# Patient Record
Sex: Female | Born: 1985 | Race: White | Hispanic: No | Marital: Married | Smoking: Never smoker
Health system: Southern US, Community
[De-identification: ages and names within clinical notes are randomized; demographics above are authoritative.]

## PROBLEM LIST (undated history)

## (undated) ENCOUNTER — Inpatient Hospital Stay: Payer: Self-pay

## (undated) DIAGNOSIS — E538 Deficiency of other specified B group vitamins: Secondary | ICD-10-CM

## (undated) DIAGNOSIS — M419 Scoliosis, unspecified: Secondary | ICD-10-CM

## (undated) DIAGNOSIS — K589 Irritable bowel syndrome without diarrhea: Secondary | ICD-10-CM

## (undated) DIAGNOSIS — E039 Hypothyroidism, unspecified: Secondary | ICD-10-CM

## (undated) DIAGNOSIS — O24419 Gestational diabetes mellitus in pregnancy, unspecified control: Secondary | ICD-10-CM

## (undated) DIAGNOSIS — T7840XA Allergy, unspecified, initial encounter: Secondary | ICD-10-CM

## (undated) HISTORY — PX: CHOLECYSTECTOMY: SHX55

## (undated) HISTORY — DX: Gestational diabetes mellitus in pregnancy, unspecified control: O24.419

## (undated) HISTORY — DX: Scoliosis, unspecified: M41.9

## (undated) HISTORY — DX: Irritable bowel syndrome, unspecified: K58.9

## (undated) HISTORY — DX: Allergy, unspecified, initial encounter: T78.40XA

## (undated) HISTORY — PX: WISDOM TOOTH EXTRACTION: SHX21

## (undated) HISTORY — PX: COLONOSCOPY: SHX174

---

## 2009-02-04 ENCOUNTER — Ambulatory Visit: Payer: Self-pay | Admitting: Sports Medicine

## 2009-02-04 DIAGNOSIS — M66259 Spontaneous rupture of extensor tendons, unspecified thigh: Secondary | ICD-10-CM | POA: Insufficient documentation

## 2009-02-15 ENCOUNTER — Telehealth (INDEPENDENT_AMBULATORY_CARE_PROVIDER_SITE_OTHER): Payer: Self-pay | Admitting: *Deleted

## 2009-02-15 ENCOUNTER — Encounter: Payer: Self-pay | Admitting: Sports Medicine

## 2009-07-15 ENCOUNTER — Emergency Department: Payer: Self-pay | Admitting: Emergency Medicine

## 2010-07-28 ENCOUNTER — Ambulatory Visit: Payer: Self-pay | Admitting: Urology

## 2010-08-01 ENCOUNTER — Ambulatory Visit: Payer: Self-pay | Admitting: Urology

## 2010-12-25 ENCOUNTER — Encounter: Payer: Self-pay | Admitting: *Deleted

## 2012-07-18 ENCOUNTER — Other Ambulatory Visit: Payer: Self-pay | Admitting: Internal Medicine

## 2012-07-18 ENCOUNTER — Ambulatory Visit: Payer: Self-pay | Admitting: Internal Medicine

## 2012-07-18 LAB — PROTIME-INR: INR: 0.9

## 2012-07-19 ENCOUNTER — Ambulatory Visit: Payer: Self-pay | Admitting: Surgery

## 2012-07-24 LAB — PATHOLOGY REPORT

## 2014-08-19 ENCOUNTER — Emergency Department: Payer: Self-pay | Admitting: Emergency Medicine

## 2014-08-19 LAB — COMPREHENSIVE METABOLIC PANEL
ALBUMIN: 3.7 g/dL (ref 3.4–5.0)
ALK PHOS: 68 U/L
Anion Gap: 12 (ref 7–16)
BUN: 7 mg/dL (ref 7–18)
Bilirubin,Total: 0.5 mg/dL (ref 0.2–1.0)
CALCIUM: 8.7 mg/dL (ref 8.5–10.1)
CHLORIDE: 109 mmol/L — AB (ref 98–107)
CREATININE: 0.52 mg/dL — AB (ref 0.60–1.30)
Co2: 22 mmol/L (ref 21–32)
Glucose: 71 mg/dL (ref 65–99)
POTASSIUM: 4.6 mmol/L (ref 3.5–5.1)
SGOT(AST): 38 U/L — ABNORMAL HIGH (ref 15–37)
SGPT (ALT): 27 U/L
Sodium: 143 mmol/L (ref 136–145)
TOTAL PROTEIN: 7.3 g/dL (ref 6.4–8.2)

## 2014-08-19 LAB — URINALYSIS, COMPLETE
BACTERIA: NONE SEEN
Bilirubin,UR: NEGATIVE
GLUCOSE, UR: NEGATIVE mg/dL (ref 0–75)
KETONE: NEGATIVE
LEUKOCYTE ESTERASE: NEGATIVE
NITRITE: NEGATIVE
PROTEIN: NEGATIVE
Ph: 7 (ref 4.5–8.0)
Specific Gravity: 1.004 (ref 1.003–1.030)
WBC UR: 2 /HPF (ref 0–5)

## 2014-08-19 LAB — CBC
HCT: 45.6 % (ref 35.0–47.0)
HGB: 15 g/dL (ref 12.0–16.0)
MCH: 29.3 pg (ref 26.0–34.0)
MCHC: 32.9 g/dL (ref 32.0–36.0)
MCV: 89 fL (ref 80–100)
PLATELETS: 251 10*3/uL (ref 150–440)
RBC: 5.12 10*6/uL (ref 3.80–5.20)
RDW: 12.5 % (ref 11.5–14.5)
WBC: 6.8 10*3/uL (ref 3.6–11.0)

## 2014-08-19 LAB — WET PREP, GENITAL

## 2014-08-19 LAB — LIPASE, BLOOD: Lipase: 99 U/L (ref 73–393)

## 2014-08-20 LAB — GC/CHLAMYDIA PROBE AMP

## 2014-09-30 ENCOUNTER — Ambulatory Visit: Payer: Self-pay | Admitting: Gastroenterology

## 2014-10-05 ENCOUNTER — Ambulatory Visit: Payer: Self-pay | Admitting: Gastroenterology

## 2014-11-17 ENCOUNTER — Ambulatory Visit: Payer: Self-pay

## 2015-02-09 NOTE — H&P (Signed)
PATIENT NAME:  Kari Moore, Kari Moore MR#:  [id number] DATE OF BIRTH:  [birth year]  DATE OF ADMISSION:  07/18/2012  HISTORY OF PRESENT ILLNESS: This 29 year old accompanied by her mother comes in the office with chief complaint of epigastric pain which started two days ago, was worse yesterday, was in the epigastric area and slightly to the right of the midline accompanied by nausea and decreased oral intake. She was prescribed Dexilant which did not help. She has also had some dizziness. She reports no radiation into her back. Minimal degree of heartburn. She is moving her bowels satisfactorily, voiding satisfactorily. Has noted no change in skin color.   She had ultrasound which demonstrated some slight thickening of the gallbladder wall, positive ultrasonic Murphy sign and sludge within the gallbladder.   PAST MEDICAL HISTORY: Has had scoliosis and some mild chronic intermittent back pains.  She has no history of hepatitis. No other major medical problems.   PAST SURGICAL HISTORY: Extraction of wisdom teeth.  MEDICATIONS: None.    ALLERGIES: None.   FAMILY HISTORY: Positive for hypertension and ulcers.   SOCIAL HISTORY: Does not smoke. Does not drink any alcohol. She is accompanied by her mother.   REVIEW OF SYSTEMS: Has had some mild increasing pain with deep breath. Review of systems otherwise negative.   PHYSICAL EXAMINATION:   GENERAL: She is awake, alert, and oriented resting on the exam table.   VITAL SIGNS: Height 5 feet 1 inch, weight 131, blood pressure 106/73, pulse 77.   SKIN: Warm and dry without rash or jaundice.   HEENT: Pupils equal, reactive to light. Extraocular movements are intact. Sclerae clear. Pharynx clear.   NECK: No palpable mass.   LUNGS: Lung sounds are clear.   HEART: Regular rhythm, S1, S2.   ABDOMEN: Moderate right upper quadrant tenderness with some guarding. Lower abdomen is soft and nontender.   EXTREMITIES: No dependent edema.   NEUROLOGIC:  Awake, alert, oriented, moving all extremities.   LABORATORY DATA: Lab work done today including metabolic panel C was normal. CBC normal. Amylase normal.   I reviewed her ultrasound which demonstrates sludge within the gallbladder, slight thickening of gallbladder wall.   IMPRESSION: Acute cholecystitis, cholelithiasis.   RECOMMENDATIONS: I recommended laparoscopic cholecystectomy. I discussed the operation, care, risks and benefits with her in detail. We are going to get this set up for tomorrow. I have called the operating room and also discussed with outpatient surgery department. Will have her arrive there tomorrow at 11:15.   I discussed sipping slowly on liquids and eating some low fat foods this evening and n.p.o. past midnight.   ____________________________ J. Renda RollsWilton Smith, MD jws:drc D: 07/18/2012 16:01:57 ET T: 07/18/2012 16:25:37 ET JOB#: [id number] cc: Adella HareJ. Wilton Smith, MD, <Dictator>, Danella PentonMark F. Miller, MD  Adella HareWILTON J SMITH MD ELECTRONICALLY SIGNED 07/19/2012 9:45

## 2015-02-09 NOTE — Op Note (Signed)
PATIENT NAME:  Kari Moore, Kari Moore MR#:  [id number] DATE OF BIRTH:  24-Jan-1986  DATE OF PROCEDURE:  07/19/2012  PREOPERATIVE DIAGNOSIS: Cholecystitis, cholelithiasis.   POSTOPERATIVE DIAGNOSIS: Cholecystitis, cholelithiasis.   PROCEDURE: Laparoscopic cholecystectomy.   SURGEON: Adella HareJ. Wilton Smith, MD  ANESTHESIA: General.   INDICATIONS: This 29 year old female came in with epigastric pain and nausea. Ultrasound findings of sludge within the gallbladder and slight thickening of the gallbladder wall with right upper quadrant tenderness. Surgery was recommended for definitive treatment.   DESCRIPTION OF PROCEDURE: The patient was placed on the operating table in the supine position under general endotracheal anesthesia. The abdomen was prepared with ChloraPrep and draped in a sterile manner. A short incision was made in the inferior aspect of the umbilicus and carried down to the deep fascia which was grasped with laryngeal hook and elevated. A Veress needle was inserted, aspirated, and irrigated with a saline solution. Next, the peritoneal cavity was inflated with carbon dioxide. The Veress needle was removed. The 10 mm cannula was inserted. The 10 mm, 0 degree laparoscope was inserted to view the peritoneal cavity. The liver appeared normal. Uterus appeared normal and visible intestines appeared normal. Another incision was made in the epigastrium slightly to the right of the midline to introduce an 11 mm cannula. Two incisions were made in the lateral aspect of the right upper quadrant to introduce two 5 mm cannulas.   The gallbladder was retracted towards the right shoulder. The infundibulum was retracted inferiorly and laterally. The porta hepatis was demonstrated. The gallbladder was mobilized with incision of the visceral peritoneum. The cystic duct was dissected free from surrounding structures. Cystic artery was dissected free from surrounding structures. A critical view of safety was demonstrated.  An endoclip was placed across the cystic duct adjacent to the neck of the gallbladder. An incision was made in the cystic duct to introduce a Reddick catheter, however, it was found that the cystic duct was very small and the Reddick catheter would not thread and therefore cholangiogram was not done. The Reddick catheter was removed. The cystic duct was doubly ligated with endoclips and divided. The cystic artery was controlled with a single endoclip and divided. The gallbladder was dissected free from the liver with hook and cautery. Bleeding was very scant. Hemostasis was subsequently intact. The gallbladder was delivered up through the infraumbilical incision, opened and suctioned, removed and identified a small amount of sludge within the gallbladder and was submitted in formalin for routine pathology. The right upper quadrant was further inspected. Hemostasis was intact. The cannulas were removed. Carbon dioxide was allowed to escape from the peritoneal cavity. The fascial defect at the umbilicus was closed with a 3-0 Vicryl simple suture. The skin incisions were closed with interrupted 5-0 chromic subcuticular sutures, benzoin, and Steri-Strips. Dressings were applied with paper tape. The patient tolerated surgery satisfactorily and was moved to the recovery room for postoperative care.  ____________________________ J. Renda RollsWilton Smith, MD jws:cms D: 07/19/2012 14:58:55 ET T: 07/19/2012 15:59:04 ET JOB#: [id number]  cc: Adella HareJ. Wilton Smith, MD, <Dictator> Adella HareWILTON J SMITH MD ELECTRONICALLY SIGNED 07/20/2012 18:27

## 2015-09-20 IMAGING — CT CT ABD-PELV W/ CM
2 of 4 series · 16 of 46 positions shown, 18 images · IV contrast (isovue)
Comparison: None.

CLINICAL DATA: Right lower quadrant pain

EXAM:
CT ABDOMEN AND PELVIS WITH CONTRAST
TECHNIQUE: Multidetector CT imaging of the abdomen and pelvis was performed
using the standard protocol following bolus administration of
intravenous contrast.
CONTRAST:  100 cc of Isovue 300

[Series 2: routine abd pel with · axial · 0.65mm/px · z∈[-986,-591]mm · 13 of 87 slices shown, 15 images]
[im 4/87  soft-tissue]
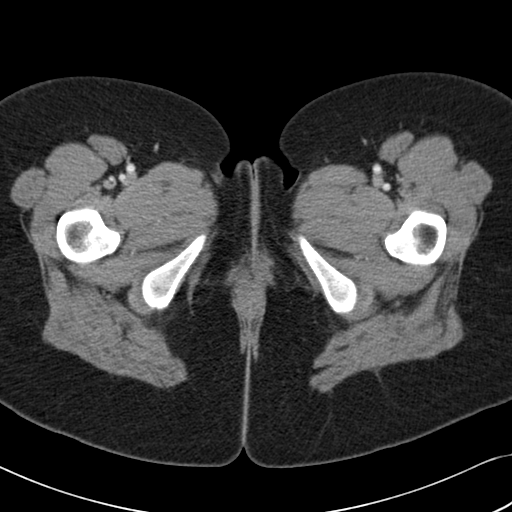
[im 4/87  bone]
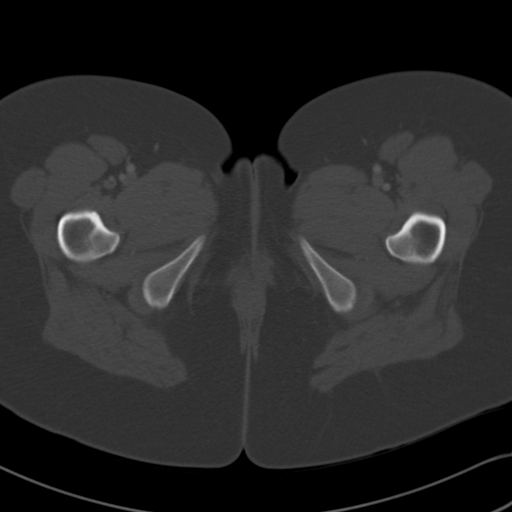
[im 11/87  soft-tissue]
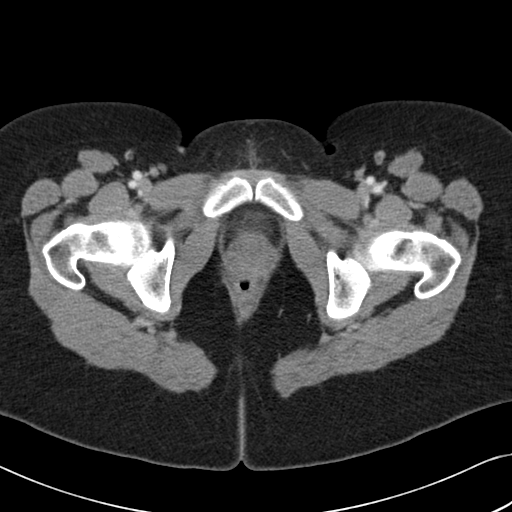
[im 18/87  soft-tissue]
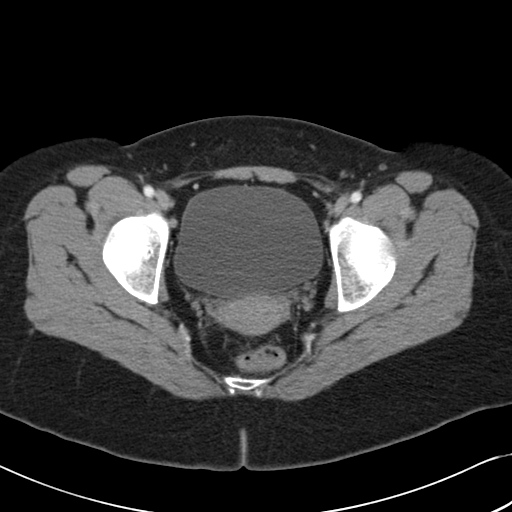
[im 25/87  soft-tissue]
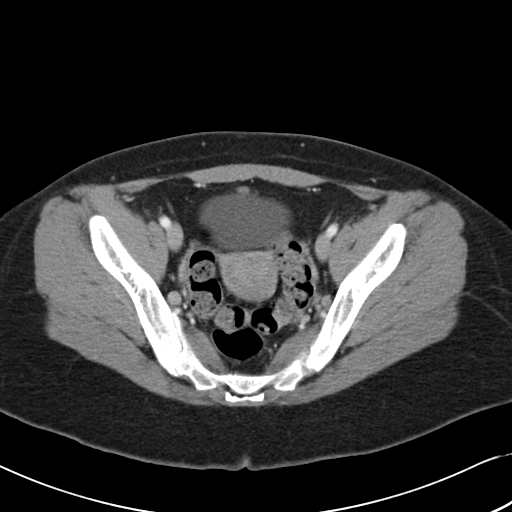
[im 31/87  soft-tissue]
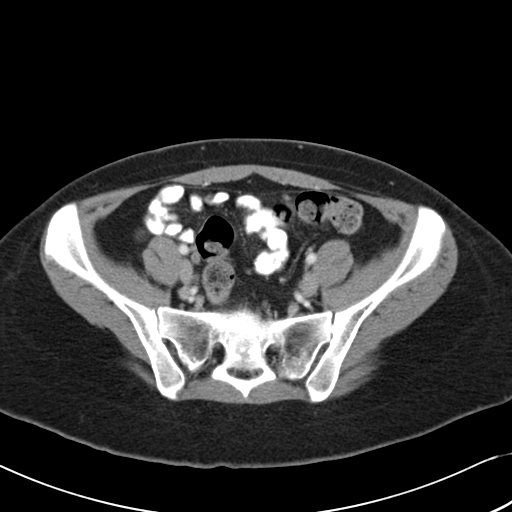
[im 38/87  soft-tissue]
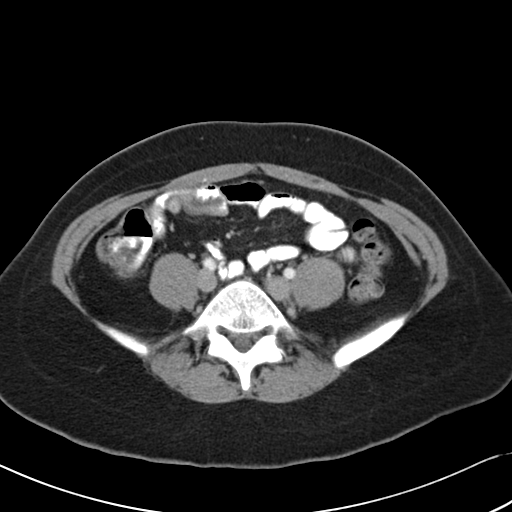
[im 45/87  soft-tissue]
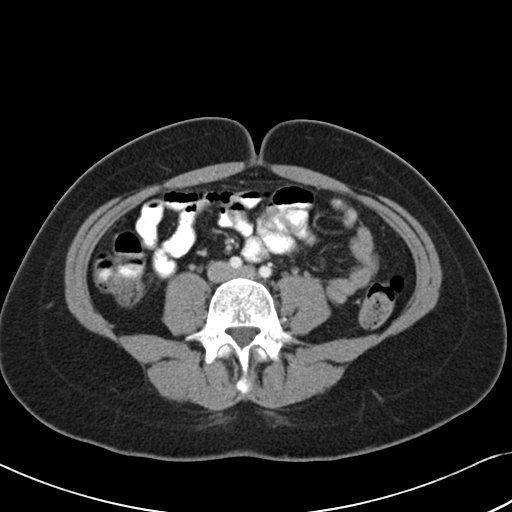
[im 49/87  soft-tissue]
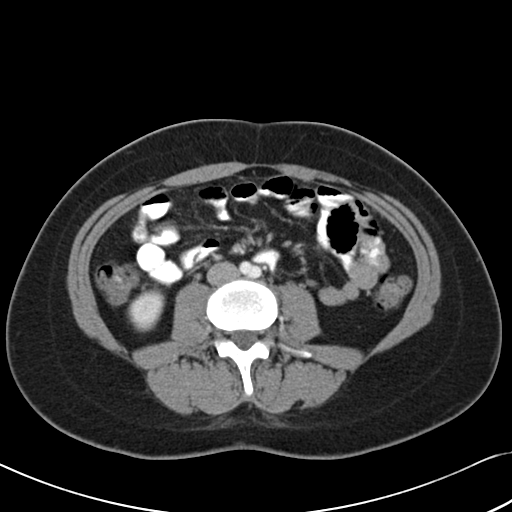
[im 56/87  soft-tissue]
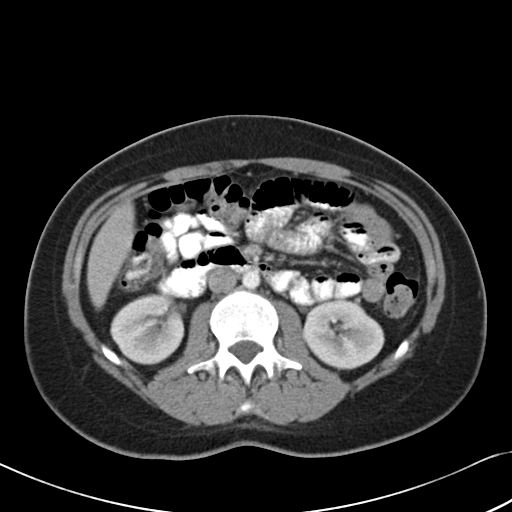
[im 56/87  bone]
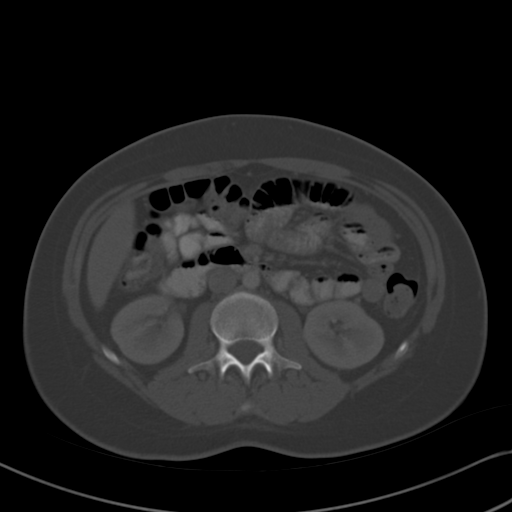
[im 62/87  soft-tissue]
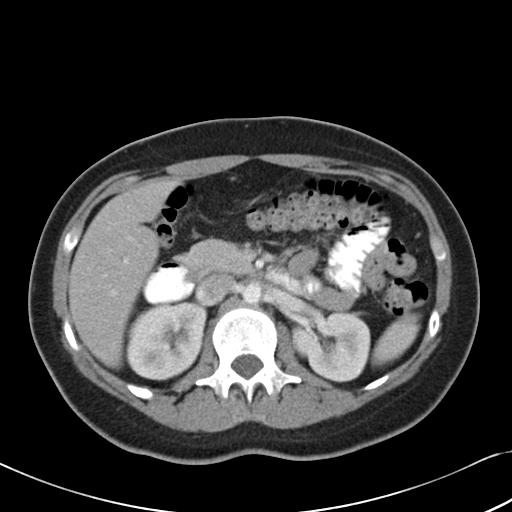
[im 69/87  soft-tissue]
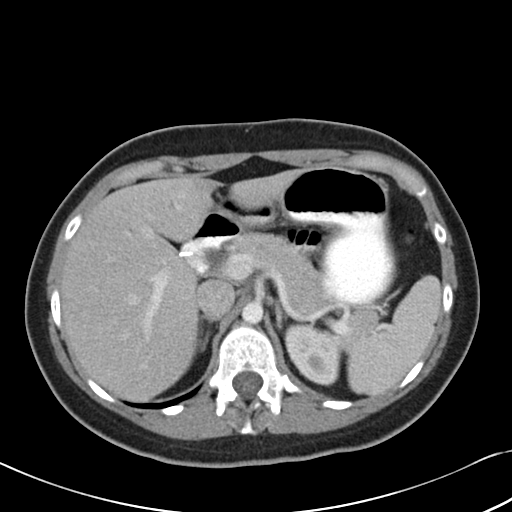
[im 76/87  soft-tissue]
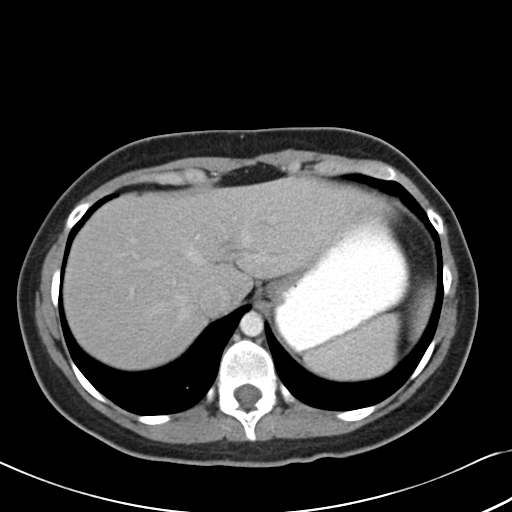
[im 83/87  soft-tissue]
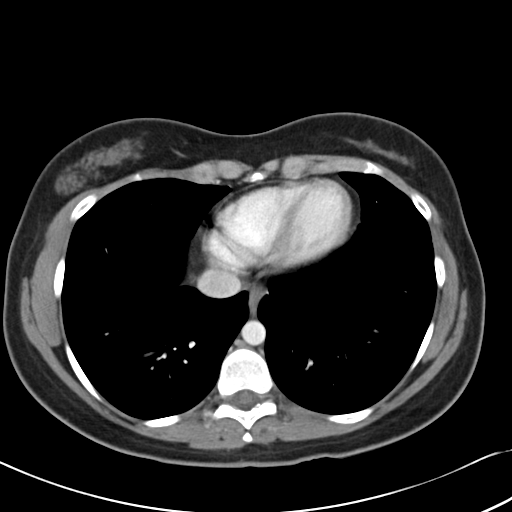

[Series 5: cor routine abd pel with · coronal · 0.59mm/px · 3 of 112 slices shown]
[im 38/112  soft-tissue]
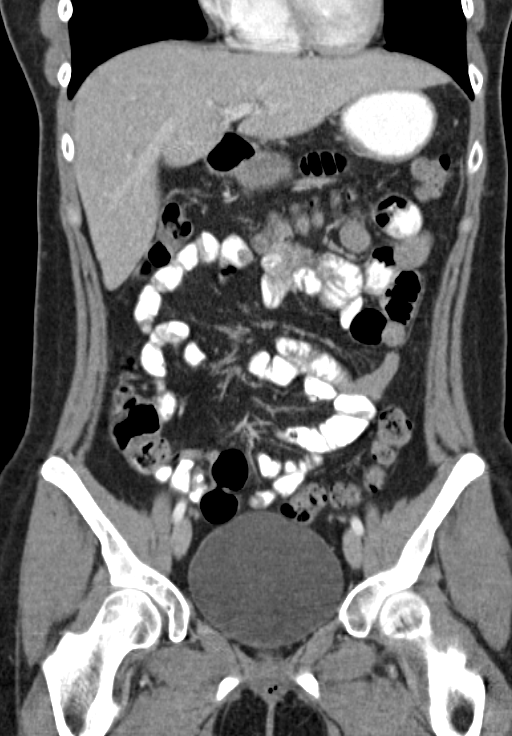
[im 50/112  soft-tissue]
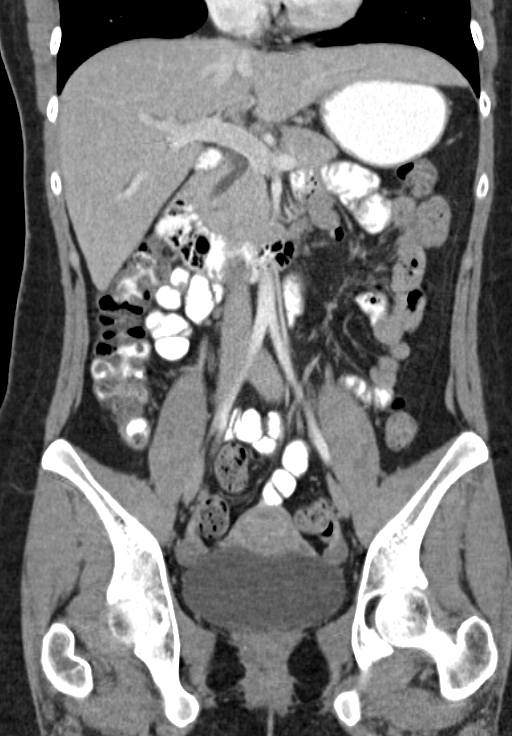
[im 62/112  soft-tissue]
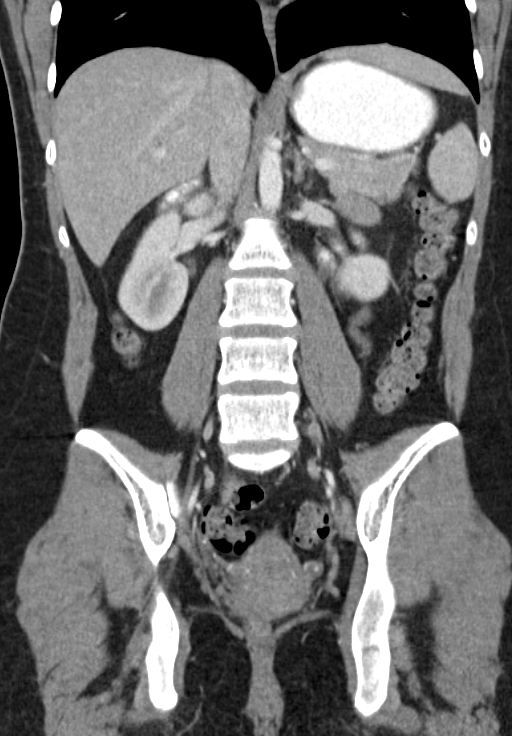

[16 of 46 positions shown; findings below may reference images not displayed]

FINDINGS: Lower chest: The lung bases are clear. No pleural or pericardial
effusion.

Hepatobiliary: No suspicious liver abnormality. Previous coli
cystectomy. The common bile duct measures 6 mm.

Pancreas: Normal appearance of the pancreas.

Spleen: The spleen is unremarkable.

Adrenals/Urinary Tract: The adrenal glands are both normal. Normal
appearance of both kidneys. The urinary bladder appears within
normal limits.

Stomach/Bowel: The stomach is normal. The small bowel loops have a
normal course and caliber. No evidence for bowel obstruction. The
appendix is visualized and is normal. Normal appearance of the
colon.

Vascular/Lymphatic: Normal caliber of the abdominal aorta. There is
no retroperitoneal or small bowel mesenteric adenopathy. No pelvic
or inguinal adenopathy identified.

Reproductive: The uterus and the adnexal structures have a normal
physiologic appearance.

Other: There is no free fluid or fluid collections within the
abdomen or the pelvis.

Musculoskeletal: Review of the visualized osseous structures is
unremarkable.
IMPRESSION: 1. No acute findings identified within the abdomen or the pelvis.
2. Status post cholecystectomy. The common bile duct is upper limits
of normal in caliber measuring 6 mm.
3. The appendix is visualized and appears normal.

## 2016-06-15 ENCOUNTER — Other Ambulatory Visit: Payer: Self-pay | Admitting: Internal Medicine

## 2016-06-15 ENCOUNTER — Ambulatory Visit
Admission: RE | Admit: 2016-06-15 | Discharge: 2016-06-15 | Disposition: A | Discharge status: Home / Self Care | Payer: BC Managed Care – PPO | Source: Ambulatory Visit | Attending: Internal Medicine | Admitting: Internal Medicine

## 2016-06-15 DIAGNOSIS — N23 Unspecified renal colic: Secondary | ICD-10-CM | POA: Insufficient documentation

## 2016-06-15 DIAGNOSIS — R1013 Epigastric pain: Secondary | ICD-10-CM | POA: Diagnosis not present

## 2016-06-15 DIAGNOSIS — R935 Abnormal findings on diagnostic imaging of other abdominal regions, including retroperitoneum: Secondary | ICD-10-CM | POA: Diagnosis not present

## 2016-06-15 DIAGNOSIS — R1031 Right lower quadrant pain: Secondary | ICD-10-CM

## 2016-08-16 ENCOUNTER — Encounter: Payer: Self-pay | Admitting: *Deleted

## 2016-08-17 ENCOUNTER — Encounter
Admission: RE | Disposition: A | Discharge status: Home / Self Care | Payer: Self-pay | Source: Ambulatory Visit | Attending: Unknown Physician Specialty

## 2016-08-17 ENCOUNTER — Ambulatory Visit
Admission: RE | Admit: 2016-08-17 | Discharge: 2016-08-17 | Disposition: A | Discharge status: Home / Self Care | Payer: BC Managed Care – PPO | Source: Ambulatory Visit | Attending: Unknown Physician Specialty | Admitting: Unknown Physician Specialty

## 2016-08-17 ENCOUNTER — Ambulatory Visit: Payer: BC Managed Care – PPO | Admitting: Anesthesiology

## 2016-08-17 ENCOUNTER — Encounter: Payer: Self-pay | Admitting: *Deleted

## 2016-08-17 DIAGNOSIS — Z8379 Family history of other diseases of the digestive system: Secondary | ICD-10-CM | POA: Diagnosis not present

## 2016-08-17 DIAGNOSIS — J309 Allergic rhinitis, unspecified: Secondary | ICD-10-CM | POA: Insufficient documentation

## 2016-08-17 DIAGNOSIS — K640 First degree hemorrhoids: Secondary | ICD-10-CM | POA: Diagnosis not present

## 2016-08-17 DIAGNOSIS — K219 Gastro-esophageal reflux disease without esophagitis: Secondary | ICD-10-CM | POA: Insufficient documentation

## 2016-08-17 DIAGNOSIS — Z888 Allergy status to other drugs, medicaments and biological substances status: Secondary | ICD-10-CM | POA: Insufficient documentation

## 2016-08-17 DIAGNOSIS — R109 Unspecified abdominal pain: Secondary | ICD-10-CM | POA: Diagnosis present

## 2016-08-17 DIAGNOSIS — M419 Scoliosis, unspecified: Secondary | ICD-10-CM | POA: Insufficient documentation

## 2016-08-17 DIAGNOSIS — Z79899 Other long term (current) drug therapy: Secondary | ICD-10-CM | POA: Diagnosis not present

## 2016-08-17 DIAGNOSIS — R1011 Right upper quadrant pain: Secondary | ICD-10-CM | POA: Insufficient documentation

## 2016-08-17 DIAGNOSIS — Z8249 Family history of ischemic heart disease and other diseases of the circulatory system: Secondary | ICD-10-CM | POA: Diagnosis not present

## 2016-08-17 DIAGNOSIS — Z885 Allergy status to narcotic agent status: Secondary | ICD-10-CM | POA: Insufficient documentation

## 2016-08-17 DIAGNOSIS — K2950 Unspecified chronic gastritis without bleeding: Secondary | ICD-10-CM | POA: Insufficient documentation

## 2016-08-17 HISTORY — PX: COLONOSCOPY WITH PROPOFOL: SHX5780

## 2016-08-17 HISTORY — PX: ESOPHAGOGASTRODUODENOSCOPY (EGD) WITH PROPOFOL: SHX5813

## 2016-08-17 LAB — POCT PREGNANCY, URINE: PREG TEST UR: NEGATIVE

## 2016-08-17 SURGERY — COLONOSCOPY WITH PROPOFOL
Anesthesia: General

## 2016-08-17 MED ORDER — PROPOFOL 500 MG/50ML IV EMUL
INTRAVENOUS | Status: DC | PRN
  Administered 2016-08-17: 125 ug/kg/min via INTRAVENOUS

## 2016-08-17 MED ORDER — PROPOFOL 10 MG/ML IV BOLUS
INTRAVENOUS | Status: DC | PRN
  Administered 2016-08-17 (×3): 50 mg via INTRAVENOUS
  Administered 2016-08-17: 30 mg via INTRAVENOUS

## 2016-08-17 MED ORDER — GLYCOPYRROLATE 0.2 MG/ML IJ SOLN
INTRAMUSCULAR | Status: DC | PRN
  Administered 2016-08-17: 0.2 mg via INTRAVENOUS

## 2016-08-17 MED ORDER — SODIUM CHLORIDE 0.9 % IV SOLN
INTRAVENOUS | Status: DC
Start: 2016-08-17 — End: 2016-08-17
  Administered 2016-08-17: 1000 mL via INTRAVENOUS

## 2016-08-17 MED ORDER — MIDAZOLAM HCL 2 MG/2ML IJ SOLN
INTRAMUSCULAR | Status: DC | PRN
  Administered 2016-08-17: 2 mg via INTRAVENOUS

## 2016-08-17 NOTE — H&P (Signed)
Primary Care Physician:  Rusty Aus, MD Primary Gastroenterologist:  Dr. Vira Agar  Pre-Procedure History & Physical: HPI:  Kari Moore is a 30 y.o. female is here for an endoscopy and colonoscopy.   History reviewed. No pertinent past medical history.  Past Surgical History:  Procedure Laterality Date  . CHOLECYSTECTOMY    . COLONOSCOPY      Prior to Admission medications   Medication Sig Start Date End Date Taking? Authorizing Provider  Alum & Mag Hydroxide-Simeth (GI COCKTAIL) SUSP suspension Take 30 mLs by mouth 4 (four) times daily as needed for indigestion. Shake well.   Yes Historical Provider, MD  dicyclomine (BENTYL) 10 MG capsule Take 10 mg by mouth 4 (four) times daily -  before meals and at bedtime.   Yes Historical Provider, MD  fluticasone (FLONASE) 50 MCG/ACT nasal spray Place 2 sprays into both nostrils daily.   Yes Historical Provider, MD  hyoscyamine (LEVSIN, ANASPAZ) 0.125 MG tablet Take 0.125 mg by mouth every 4 (four) hours as needed.   Yes Historical Provider, MD  ibuprofen (ADVIL,MOTRIN) 200 MG tablet Take 200 mg by mouth every 6 (six) hours as needed.   Yes Historical Provider, MD  loratadine (CLARITIN) 10 MG tablet Take 10 mg by mouth daily.   Yes Historical Provider, MD  montelukast (SINGULAIR) 10 MG tablet Take 10 mg by mouth at bedtime.   Yes Historical Provider, MD  norethindrone-ethinyl estradiol (CYCLAFEM,ALYACEN) 0.5/0.75/1-35 MG-MCG tablet Take 1 tablet by mouth daily.   Yes Historical Provider, MD  pantoprazole (PROTONIX) 40 MG tablet Take 40 mg by mouth daily.   Yes Historical Provider, MD  PredniSONE (STERAPRED 12 DAY) 5 MG KIT Take by mouth as directed.     Yes Historical Provider, MD  nitroGLYCERIN (NITRODUR) 0.2 mg/hr 1/4 patch to affected area in AM.  Remove before bed.  Increase to 1/2 patch after one month if symptoms not improving     Historical Provider, MD    Allergies as of 08/16/2016 - Review Complete 08/16/2016  Allergen Reaction  Noted  . Compazine [prochlorperazine edisylate]  08/16/2016  . Morphine and related  08/16/2016  . Voltaren [diclofenac sodium]  08/16/2016    History reviewed. No pertinent family history.  Social History   Social History  . Marital status: Married    Spouse name: N/A  . Number of children: N/A  . Years of education: N/A   Occupational History  . Not on file.   Social History Main Topics  . Smoking status: Never Smoker  . Smokeless tobacco: Never Used  . Alcohol use No  . Drug use: No  . Sexual activity: Not on file   Other Topics Concern  . Not on file   Social History Narrative  . No narrative on file    Review of Systems: See HPI, otherwise negative ROS  Physical Exam: BP 120/90   Pulse (!) 59   Temp 97.1 F (36.2 C) (Tympanic)   Resp (!) 21   Ht '5\' 1"'  (1.549 m)   Wt 72.6 kg (160 lb)   LMP 08/17/2016 (Exact Date)   SpO2 100%   BMI 30.23 kg/m  General:   Alert,  pleasant and cooperative in NAD Head:  Normocephalic and atraumatic. Neck:  Supple; no masses or thyromegaly. Lungs:  Clear throughout to auscultation.    Heart:  Regular rate and rhythm. Abdomen:  Soft, nontender and nondistended. Normal bowel sounds, without guarding, and without rebound.   Neurologic:  Alert and  oriented  x4;  grossly normal neurologically.  Impression/Plan: Kari Moore is here for an endoscopy and colonoscopy to be performed for abdominal pain  Risks, benefits, limitations, and alternatives regarding  endoscopy and colonoscopy have been reviewed with the patient.  Questions have been answered.  All parties agreeable.   Gaylyn Cheers, MD  08/17/2016, 3:23 PM

## 2016-08-17 NOTE — Op Note (Signed)
[facility] Patient Name: Kari Moore Procedure Date: 08/17/2016 2:02 PM MRN: [id number] Account #: [id number] Date of Birth: 04-17-1986 Admit Type: Outpatient Age: [age] [facility] 3 Gender: Female Note Status: Finalized Procedure:            Colonoscopy Indications:          Abdominal pain in the right upper quadrant Providers:            Scot Junobert T. Shanya Ferriss, MD Referring MD:         Danella PentonMark F. Miller, MD (Referring MD) Medicines:            Propofol per Anesthesia Complications:        No immediate complications. Procedure:            Pre-Anesthesia Assessment:                       - After reviewing the risks and benefits, the patient                        was deemed in satisfactory condition to undergo the                        procedure.                       After obtaining informed consent, the colonoscope was                        passed under direct vision. Throughout the procedure,                        the patient's blood pressure, pulse, and oxygen                        saturations were monitored continuously. The                        Colonoscope was introduced through the anus and                        advanced to the the cecum, identified by appendiceal                        orifice and ileocecal valve. The colonoscopy was                        performed without difficulty. The patient tolerated the                        procedure well. The quality of the bowel preparation                        was excellent. Findings:      The entire colon (entire examined portion) appeared normal. Biopsies for       histology were taken with a cold forceps from the right colon and       descending colon for evaluation of possible microscopic colitis as a       cause for her diarrhea..      The terminal ileum appeared normal. It was entered about 10-15cm.  The exam was otherwise without abnormality.      Internal hemorrhoids  were found during endoscopy. The hemorrhoids were       medium-sized and Grade I (internal hemorrhoids that do not prolapse). Impression:           - The entire examined colon is normal. Biopsied.                       - The examined portion of the ileum was normal.                       - The examination was otherwise normal. Recommendation:       - Await pathology results. Antidiarrhea medicine, low                        dose tricyclic medicine such as imipramine at bed time.                        Surgical consult. Scot Jun, MD 08/17/2016 2:46:29 PM This report has been signed electronically. Number of Addenda: 0 Note Initiated On: 08/17/2016 2:02 PM Scope Withdrawal Time: 0 hours 12 minutes 39 seconds  Total Procedure Duration: 0 hours 18 minutes 23 seconds       [facility]

## 2016-08-17 NOTE — Op Note (Signed)
[facility] Patient Name: Kari MastersCasey Moore Procedure Date: 08/17/2016 2:02 PM MRN: [id number] Account #: [id number] Date of Birth: 1986/01/15 Admit Type: Outpatient Age: [age] [facility] Gender: Female Note Status: Finalized Procedure:            Upper GI endoscopy Indications:          Abdominal pain in the right upper quadrant Providers:            Scot Junobert T. Elliott, MD Referring MD:         Danella PentonMark F. Miller, MD (Referring MD) Medicines:            Propofol per Anesthesia Complications:        No immediate complications. Procedure:            Pre-Anesthesia Assessment:                       - After reviewing the risks and benefits, the patient                        was deemed in satisfactory condition to undergo the                        procedure.                       After obtaining informed consent, the endoscope was                        passed under direct vision. Throughout the procedure,                        the patient's blood pressure, pulse, and oxygen                        saturations were monitored continuously. The Endoscope                        was introduced through the mouth, and advanced to the                        second part of duodenum. The upper GI endoscopy was                        accomplished without difficulty. The patient tolerated                        the procedure well. Findings:      The examined esophagus was normal. Gastroesophageal junction at 36cm.      Localized mildly erythematous mucosa without bleeding was found at the       gastroesophageal junction and in the cardia. Biopsies were taken with a       cold forceps for histology. Biopsies were taken with a cold forceps for       Helicobacter pylori testing.      Patchy mildly erythematous mucosa without bleeding was found in the       gastric body and in the gastric antrum. Biopsies were taken with a cold       forceps for histology.  Biopsies were taken with a cold forceps for       Helicobacter pylori testing.  The duodenal bulb and second portion of the duodenum were normal.       Biopsies were taken with a cold forceps for histology.      No obvious cause seen for her abd pain on this exam. Impression:           - Normal esophagus.                       - Erythematous mucosa in the gastroesophageal junction                        and cardia. Biopsied.                       - Erythematous mucosa in the gastric body and antrum.                        Biopsied.                       - Normal duodenal bulb and second portion of the                        duodenum. Biopsied. Recommendation:       - Await pathology results.                       - Perform a colonoscopy as previously scheduled. Scot Jun, MD 08/17/2016 2:19:47 PM This report has been signed electronically. Number of Addenda: 0 Note Initiated On: 08/17/2016 2:02 PM      [facility]

## 2016-08-17 NOTE — Anesthesia Preprocedure Evaluation (Signed)
Anesthesia Evaluation  Patient identified by MRN, date of birth, ID band Patient awake    Reviewed: Allergy & Precautions, H&P , NPO status , Patient's Chart, lab work & pertinent test results  History of Anesthesia Complications Negative for: history of anesthetic complications  Airway Mallampati: II  TM Distance: >3 FB Neck ROM: full    Dental  (+) Poor Dentition   Pulmonary neg pulmonary ROS, neg shortness of breath,    Pulmonary exam normal breath sounds clear to auscultation       Cardiovascular Exercise Tolerance: Good (-) angina(-) Past MI negative cardio ROS Normal cardiovascular exam Rhythm:regular Rate:Normal     Neuro/Psych negative neurological ROS  negative psych ROS   GI/Hepatic negative GI ROS, Neg liver ROS, GERD  Controlled,  Endo/Other  negative endocrine ROS  Renal/GU negative Renal ROS  negative genitourinary   Musculoskeletal   Abdominal   Peds  Hematology negative hematology ROS (+)   Anesthesia Other Findings  Past Surgical History: No date: CHOLECYSTECTOMY No date: COLONOSCOPY  BMI    Body Mass Index:  30.23 kg/m      Reproductive/Obstetrics negative OB ROS                           Anesthesia Physical Anesthesia Plan  ASA: III  Anesthesia Plan: General   Post-op Pain Management:    Induction:   Airway Management Planned:   Additional Equipment:   Intra-op Plan:   Post-operative Plan:   Informed Consent: I have reviewed the patients History and Physical, chart, labs and discussed the procedure including the risks, benefits and alternatives for the proposed anesthesia with the patient or authorized representative who has indicated his/her understanding and acceptance.   Dental Advisory Given  Plan Discussed with: Anesthesiologist, CRNA and Surgeon  Anesthesia Plan Comments:        Anesthesia Quick Evaluation

## 2016-08-17 NOTE — Transfer of Care (Signed)
Immediate Anesthesia Transfer of Care Note  Patient: Bonnee Quinasey A Dobransky  Procedure(s) Performed: Procedure(s): COLONOSCOPY WITH PROPOFOL (N/A) ESOPHAGOGASTRODUODENOSCOPY (EGD) WITH PROPOFOL (N/A)  Patient Location: PACU  Anesthesia Type:General  Level of Consciousness: patient cooperative  Airway & Oxygen Therapy: Patient Spontanous Breathing and Patient connected to nasal cannula oxygen  Post-op Assessment: Report given to RN and Post -op Vital signs reviewed and stable  Post vital signs: Reviewed and stable  Last Vitals:  Vitals:   08/17/16 1328 08/17/16 1446  BP: (!) 118/92 109/67  Pulse: 80 80  Resp: 18 16  Temp: 37.1 C 36.2 C    Last Pain:  Vitals:   08/17/16 1446  TempSrc: Tympanic         Complications: No apparent anesthesia complications

## 2016-08-21 ENCOUNTER — Encounter: Payer: Self-pay | Admitting: Unknown Physician Specialty

## 2016-08-21 LAB — SURGICAL PATHOLOGY

## 2016-08-23 NOTE — Anesthesia Postprocedure Evaluation (Signed)
Anesthesia Post Note  Patient: Kari Moore  Procedure(s) Performed: Procedure(s) (LRB): COLONOSCOPY WITH PROPOFOL (N/A) ESOPHAGOGASTRODUODENOSCOPY (EGD) WITH PROPOFOL (N/A)  Patient location during evaluation: Endoscopy Anesthesia Type: General Level of consciousness: awake and alert Pain management: pain level controlled Vital Signs Assessment: post-procedure vital signs reviewed and stable Respiratory status: spontaneous breathing, nonlabored ventilation, respiratory function stable and patient connected to nasal cannula oxygen Cardiovascular status: blood pressure returned to baseline and stable Postop Assessment: no signs of nausea or vomiting Anesthetic complications: no    Last Vitals:  Vitals:   08/17/16 1505 08/17/16 1515  BP: 119/88 120/90  Pulse: 70 (!) 59  Resp: 15 (!) 21  Temp:      Last Pain:  Vitals:   08/18/16 0939  TempSrc:   PainSc: 0-No pain                 Cleda MccreedyJoseph K Lorice Lafave

## 2017-05-28 DIAGNOSIS — E538 Deficiency of other specified B group vitamins: Secondary | ICD-10-CM | POA: Insufficient documentation

## 2017-10-23 NOTE — L&D Delivery Note (Signed)
VAGINAL DELIVERY NOTE:  Date of Delivery: 06/10/2018 [facility] Gestational Age/EDD: 4735w1d 06/09/2018 Antepartum complications: A1GDM, Obesity, B12 def Attending Physician: Milon Scorearon Conni Knighton, CNM Delivery Type:NSVD of viable female infant Anesthesia:Epidural for delivery, local 1% xylocaine for repair Laceration:2nd degree perineal Episiotomy: None Placenta: SDOP intact Intrapartum complications:EBL 700 mls with Cytotec 1000 mcgPR for blood loss Estimated Blood Loss: 700 ml's GBS: Neg Procedure Details: Pushing with UC's, Pt in Lithotomy pos, Vtx delivered in ROA pos with CAN x 1 reduced, Ant and post shoulders and body followed at 2358 and to mom's abd with end stage meconium on mom's abd, Baby cried initially, after 2 mins delayed cord clamping, nsy RN asked to clamp cord, CCx2 and cut by Dad and to the warmer. 3 VC noted, SDOP intact at 0002, Fundus massaged to firm and located in the Rt side of mom's abd, Bleeding noted from the perineum and the uterus, Cytotec 1000mcg per rectum was given with bleeding controlled. A 2nd degree perineal repair with 3-0 CH on CT was used. VSS. Hemostasis achieved after Cytotec. 1%xylocaine was used to infiltrate as pt was feeling superficial discomfort. Skin to skin bonding promoted. Placenta inspected and intact. No mec noted on placenta. No evidence of chorioro.  Baby: Liveborn :female , Apgars 7,9 weight#, oz, baby named "Kari Moore" Myrtie Cruisearon W. Eliese Kerwood,RN, MSN, CNM, FNP Certified Nurse Midwife [facility]

## 2017-11-19 LAB — OB RESULTS CONSOLE HIV ANTIBODY (ROUTINE TESTING): HIV: NONREACTIVE

## 2017-11-19 LAB — OB RESULTS CONSOLE VARICELLA ZOSTER ANTIBODY, IGG: VARICELLA IGG: IMMUNE

## 2017-11-19 LAB — OB RESULTS CONSOLE RUBELLA ANTIBODY, IGM: RUBELLA: IMMUNE

## 2017-11-19 LAB — OB RESULTS CONSOLE RPR: RPR: NONREACTIVE

## 2017-11-19 LAB — OB RESULTS CONSOLE HEPATITIS B SURFACE ANTIGEN: HEP B S AG: NEGATIVE

## 2018-03-20 ENCOUNTER — Observation Stay
Admission: EM | Admit: 2018-03-20 | Discharge: 2018-03-20 | Disposition: A | Discharge status: Home / Self Care | Payer: BC Managed Care – PPO | Attending: Obstetrics and Gynecology | Admitting: Obstetrics and Gynecology

## 2018-03-20 ENCOUNTER — Other Ambulatory Visit: Payer: Self-pay

## 2018-03-20 DIAGNOSIS — O4702 False labor before 37 completed weeks of gestation, second trimester: Secondary | ICD-10-CM | POA: Diagnosis present

## 2018-03-20 DIAGNOSIS — Z79890 Hormone replacement therapy (postmenopausal): Secondary | ICD-10-CM | POA: Insufficient documentation

## 2018-03-20 DIAGNOSIS — Z79899 Other long term (current) drug therapy: Secondary | ICD-10-CM | POA: Insufficient documentation

## 2018-03-20 DIAGNOSIS — E039 Hypothyroidism, unspecified: Secondary | ICD-10-CM | POA: Diagnosis not present

## 2018-03-20 DIAGNOSIS — Z888 Allergy status to other drugs, medicaments and biological substances status: Secondary | ICD-10-CM | POA: Diagnosis not present

## 2018-03-20 DIAGNOSIS — O99283 Endocrine, nutritional and metabolic diseases complicating pregnancy, third trimester: Secondary | ICD-10-CM | POA: Insufficient documentation

## 2018-03-20 DIAGNOSIS — Z3A28 28 weeks gestation of pregnancy: Secondary | ICD-10-CM | POA: Diagnosis not present

## 2018-03-20 DIAGNOSIS — Z7951 Long term (current) use of inhaled steroids: Secondary | ICD-10-CM | POA: Insufficient documentation

## 2018-03-20 DIAGNOSIS — Z885 Allergy status to narcotic agent status: Secondary | ICD-10-CM | POA: Insufficient documentation

## 2018-03-20 HISTORY — DX: Hypothyroidism, unspecified: E03.9

## 2018-03-20 LAB — URINALYSIS, COMPLETE (UACMP) WITH MICROSCOPIC
Bilirubin Urine: NEGATIVE
GLUCOSE, UA: NEGATIVE mg/dL
HGB URINE DIPSTICK: NEGATIVE
KETONES UR: 5 mg/dL — AB
NITRITE: NEGATIVE
PH: 8 (ref 5.0–8.0)
Protein, ur: NEGATIVE mg/dL
Specific Gravity, Urine: 1.003 — ABNORMAL LOW (ref 1.005–1.030)

## 2018-03-20 LAB — FETAL FIBRONECTIN: FETAL FIBRONECTIN: NEGATIVE

## 2018-03-20 MED ORDER — ACETAMINOPHEN 325 MG PO TABS: 650.0000 mg | ORAL_TABLET | ORAL | Status: DC | PRN

## 2018-03-20 MED ORDER — TERBUTALINE SULFATE 1 MG/ML IJ SOLN
0.2500 mg | Freq: Once | INTRAMUSCULAR | Status: AC
  Administered 2018-03-20: 0.25 mg via SUBCUTANEOUS
  Filled 2018-03-20: qty 1

## 2018-03-20 NOTE — Progress Notes (Signed)
Discharge instructions reviewed with pt. Pt verbalized understanding. Pt instructed to make appointment with Milon Score, CNM next week for a cervical exam.

## 2018-03-20 NOTE — Discharge Summary (Addendum)
Obstetric Discharge Summary   Patient ID: Patient Name: Kari Moore DOB: Jan 15, 1986 MRN: [id number]  Date of Admission: 03/20/2018 Date of Delivery: N/A Delivered by: N/A Date of Discharge: 03/20/2018  Primary [facility]: [facility]  LMP:No LMP recorded. Patient is pregnant. EDC Estimated Date of Delivery: 06/09/18 Gestational Age at Delivery: [redacted]w[redacted]d   Antepartum complications: [name] arrested with Terb x 1 dose Admitting Diagnosis: [name]   Secondary Diagnoses: Patient Active Problem List   Diagnosis Date Noted  . Threatened premature labor affecting pregnancy, less than 37 weeks in second trimester, antepartum 03/20/2018  . NONTRAUMATIC RUPTURE OF QUADRICEPS TENDON 02/04/2009    Augmentation: N/A Complications: N/A Intrapartum complications/course: N/A Delivery Type: N/A DC summary: pt was in active labor pattern arrested with 1 dose of Terb 0.25 mg and steroids were not given due to arrest of threatened labor. No ROM, NO VB or decreased FM. FFN:neg. Urine pending Newborn Data: This patient has no babies on file. NST reactive with 2 accels 15 x 15 BPM Labs: CBC Latest Ref Rng & Units 08/19/2014  WBC 3.6 - 11.0 x10 3/mm 3 6.8  Hemoglobin 12.0 - 16.0 g/dL 40.9  Hematocrit 81.1 - 47.0 % 45.6  Platelets 150 - 440 x10 3/mm 3 251    Physical exam:  BP 106/73 (BP Location: Left Arm)   Pulse 73   Temp 97.7 F (36.5 C) (Oral)   Resp 18   Ht 5' (1.524 m)   Wt 198 lb (89.8 kg)   SpO2 98%   BMI 38.67 kg/m  General: alert and no distress Pulm: normal respiratory effort Abdomen: soft, NT,Gravid Extremities: No evidence of DVT seen on physical exam. No lower extremity edema. CX: closed/0%/?presenting part  Disposition: stable, discharge to home  Plan:  Kari Moore was discharged to home in good condition. Follow-up appointment at [facility] with delivery provider as scheduled. Pt to see Kari Moore, CNM for a cx exam next week. Urine for C&S  sent.  Discharge Instructions: Per After Visit Summary. Activity: Advance as tolerated. Pelvic rest for now.  Diet: Regular Discharge Medications: PNV  Outpatient follow up: as scheduled   Signed: Myrtie Cruise, MSN, CNM, FNP Certified Nurse Midwife [facility] Kari Moore

## 2018-03-20 NOTE — Progress Notes (Addendum)
Kari Moore is a 32 y.o. female. She is at 66w3dgestation. No LMP recorded. Patient is pregnant. Estimated Date of Delivery: 06/09/18  [facility]   Chief complaint: UC's starting yest at 3pm and noted again at 0500am  Location: front  Onset/timing:q 2-4 mins, Duration:lasting 60 secs  Quality: mild Severity: minimal Aggravating or alleviating conditions:none  Associated signs/symptoms: no LOF, No VB Context:[name]   S: Resting comfortably. no CTX, no VB.no LOF,  Active fetal movement.   Maternal Medical History:   Past Medical History:  Diagnosis Date  . Hypothyroidism     Past Surgical History:  Procedure Laterality Date  . CHOLECYSTECTOMY    . COLONOSCOPY    . COLONOSCOPY WITH PROPOFOL N/A 08/17/2016   Procedure: COLONOSCOPY WITH PROPOFOL;  Surgeon: RManya Silvas MD;  [facility]  Service: Endoscopy;  Laterality: N/A;  . ESOPHAGOGASTRODUODENOSCOPY (EGD) WITH PROPOFOL N/A 08/17/2016   Procedure: ESOPHAGOGASTRODUODENOSCOPY (EGD) WITH PROPOFOL;  Surgeon: RManya Silvas MD;  [facility];  Service: Endoscopy;  Laterality: N/A;    Allergies  Allergen Reactions  . Compazine [Prochlorperazine Edisylate]   . Morphine And Related Hives  . Reglan [Metoclopramide]   . Voltaren [Diclofenac Sodium] Nausea Only    Prior to Admission medications   Medication Sig Start Date End Date Taking? Authorizing Provider  cetirizine (ZYRTEC) 10 MG tablet Take 10 mg by mouth daily.   Yes [provider]  citalopram (CELEXA) 20 MG tablet Take 20 mg by mouth daily.   Yes [provider]  fluticasone (FLONASE) 50 MCG/ACT nasal spray Place 2 sprays into both nostrils daily.   Yes [provider]  levothyroxine (SYNTHROID, LEVOTHROID) 75 MCG tablet Take 75 mcg by mouth daily before breakfast.   Yes [provider]  Prenatal Vit-Fe Fumarate-FA (MULTIVITAMIN-PRENATAL) 27-0.8 MG TABS tablet Take 1  tablet by mouth daily at 12 noon.   Yes [provider]  Alum & Mag Hydroxide-Simeth (GI COCKTAIL) SUSP suspension Take 30 mLs by mouth 4 (four) times daily as needed for indigestion. Shake well.    [provider]  dicyclomine (BENTYL) 10 MG capsule Take 10 mg by mouth 4 (four) times daily -  before meals and at bedtime.    [provider]  hyoscyamine (LEVSIN, ANASPAZ) 0.125 MG tablet Take 0.125 mg by mouth every 4 (four) hours as needed.    [provider]  ibuprofen (ADVIL,MOTRIN) 200 MG tablet Take 200 mg by mouth every 6 (six) hours as needed.    [provider]  loratadine (CLARITIN) 10 MG tablet Take 10 mg by mouth daily.    [provider]  montelukast (SINGULAIR) 10 MG tablet Take 10 mg by mouth at bedtime.    [provider]  nitroGLYCERIN (NITRODUR) 0.2 mg/hr 1/4 patch to affected area in AM.  Remove before bed.  Increase to 1/2 patch after one month if symptoms not improving     [provider]  norethindrone-ethinyl estradiol (CYCLAFEM,ALYACEN) 0.5/0.75/1-35 MG-MCG tablet Take 1 tablet by mouth daily.    [provider]  pantoprazole (PROTONIX) 40 MG tablet Take 40 mg by mouth daily.    [provider]  PredniSONE (STERAPRED 12 DAY) 5 MG KIT Take by mouth as directed.      [provider]     Social History: She  reports that she has never smoked. She has never used smokeless tobacco. She reports that she does not drink alcohol or use  drugs.  Family History: family history is not on file. no history of gyn cancers  Review of Systems: A full review of systems was performed and negative except as noted in the HPI.     O:  BP 106/73 (BP Location: Left Arm)   Pulse 73   Temp 97.7 F (36.5 C) (Oral)   Resp 18   Ht 5' (1.524 m)   Wt 198 lb (89.8 kg)   SpO2 99%   BMI 38.67 kg/m  No results found for this or any previous visit (from the past 48 hour(s)).   Constitutional: NAD,  AAOx3  HE/ENT: extraocular movements grossly intact, moist mucous membranes CV: RRR PULM: nl respiratory effort, CTABL     Abd: gravid, non-tender, non-distended, soft      Ext: Non-tender, Nonedmeatous   Psych: mood appropriate, speech normal Pelvic: closed, 0%/?presenting part   NST: Reactive  Baseline: 135 Variability: moderate Accelerations present x >2 Decelerations absent Time 74mns    A/P: 32y.o. 232w3dere for antenatal surveillance for [name]   Labor: not present.   Fetal Wellbeing: Reassuring Cat 1 tracing.  Reactive NST   Terb x 1st dose   ----- CaDanford BadMSN, CNM, FNAydenertified Nurse Midwife [facility]

## 2018-03-20 NOTE — Discharge Instructions (Signed)

## 2018-03-22 LAB — URINE CULTURE

## 2018-04-30 ENCOUNTER — Encounter: Payer: BC Managed Care – PPO | Attending: Obstetrics and Gynecology | Admitting: *Deleted

## 2018-04-30 ENCOUNTER — Encounter: Payer: Self-pay | Admitting: *Deleted

## 2018-04-30 VITALS — BP 94/60 | Ht 61.0 in | Wt 205.1 lb

## 2018-04-30 DIAGNOSIS — Z6838 Body mass index (BMI) 38.0-38.9, adult: Secondary | ICD-10-CM | POA: Insufficient documentation

## 2018-04-30 DIAGNOSIS — O99810 Abnormal glucose complicating pregnancy: Secondary | ICD-10-CM | POA: Diagnosis not present

## 2018-04-30 DIAGNOSIS — Z713 Dietary counseling and surveillance: Secondary | ICD-10-CM | POA: Diagnosis not present

## 2018-04-30 DIAGNOSIS — O24410 Gestational diabetes mellitus in pregnancy, diet controlled: Secondary | ICD-10-CM

## 2018-04-30 NOTE — Patient Instructions (Signed)
Read booklet on Gestational Diabetes Follow Gestational Meal Planning Guidelines Avoid fruit juice and sugar sweetened drinks unless treating a low blood sugar Complete a 3 Day Food Record and bring to next appointment Check blood sugars 4 x day - before breakfast and 2 hrs after every meal and record  Bring blood sugar log to all appointments Call MD for prescription for meter strips and lancets Strips One Touch Verio Lancets   One Touch Delica Purchase urine ketone strips if blood sugars not controlled and check urine ketones every am:  If + increase bedtime snack to 1 protein and 2 carbohydrate servings Walk 20-30 minutes at least 5 x week if permitted by MD

## 2018-04-30 NOTE — Progress Notes (Signed)
Diabetes Self-Management Education  Visit Type: First/Initial  Appt. Start Time: 1320 Appt. End Time: 1505  04/30/2018  [name], identified by name and date of birth, is a 32 y.o. female with a diagnosis of Diabetes: Gestational Diabetes.   ASSESSMENT  Blood pressure 94/60, height 5\' 1"  (1.549 m), weight 205 lb 1.6 oz (93 kg). Body mass index is 38.75 kg/m.  Diabetes Self-Management Education - 04/30/18 1641      Visit Information   Visit Type  First/Initial      Initial Visit   Diabetes Type  Gestational Diabetes    Are you currently following a meal plan?  No    Are you taking your medications as prescribed?  Yes    Date Diagnosed  June 2019      Health Coping   How would you rate your overall health?  Excellent      Psychosocial Assessment   Patient Belief/Attitude about Diabetes  Motivated to manage diabetes    Self-care barriers  None    Self-management support  Doctor's office;Family    Other persons present  Spouse/SO    Patient Concerns  Nutrition/Meal planning;Medication;Weight Control;Healthy Lifestyle    Special Needs  None    Preferred Learning Style  Visual    Learning Readiness  Change in progress    How often do you need to have someone help you when you read instructions, pamphlets, or other written materials from your doctor or pharmacy?  1 - Never    What is the last grade level you completed in school?  Masters      Pre-Education Assessment   Patient understands the diabetes disease and treatment process.  Needs Instruction    Patient understands incorporating nutritional management into lifestyle.  Needs Instruction    Patient undertands incorporating physical activity into lifestyle.  Needs Review    Patient understands using medications safely.  Needs Instruction    Patient understands monitoring blood glucose, interpreting and using results  Needs Review    Patient understands prevention, detection, and treatment of acute complications.   Needs Review    Patient understands prevention, detection, and treatment of chronic complications.  Needs Instruction    Patient understands how to develop strategies to address psychosocial issues.  Needs Instruction    Patient understands how to develop strategies to promote health/change behavior.  Needs Instruction      Complications   How often do you check your blood sugar?  3-4 times/day Pt has been using a neighbor's meter. Provided One Touch Verio Flex and instructed on use.     Fasting Blood glucose range (mg/dL)  <16;10-960<70;70-129 FBG's 45-4056-71 mg/dL. Pt denies symptoms of hypoglycemia.     Postprandial Blood glucose range (mg/dL)  98-11970-129 Most post meals are less than 120 with 1 reading of 134 mg/dL.     Have you had a dilated eye exam in the past 12 months?  Yes    Have you had a dental exam in the past 12 months?  Yes    Are you checking your feet?  Yes    How many days per week are you checking your feet?  7      Dietary Intake   Breakfast  breakfast bar; apples and peanut butter    Lunch  Malaw[place]key and cheese sandwich; apple and peanut butter    Snack (afternoon)  fruit    Dinner  chicken, beef, pork, with bread, potatoes, green peas, corn, no salad foods  Beverage(s)  Starbucks coffee daily; water      Exercise   Exercise Type  Light (walking / raking leaves)    How many days per week to you exercise?  7    How many minutes per day do you exercise?  30    Total minutes per week of exercise  210      Patient Education   Previous Diabetes Education  No    Disease state   Definition of diabetes, type 1 and 2, and the diagnosis of diabetes    Nutrition management   Role of diet in the treatment of diabetes and the relationship between the three main macronutrients and blood glucose level;Reviewed blood glucose goals for pre and post meals and how to evaluate the patients' food intake on their blood glucose level.    Physical activity and exercise   Role of exercise on diabetes  management, blood pressure control and cardiac health.    Monitoring  Taught/evaluated SMBG meter.;Purpose and frequency of SMBG.;Taught/discussed recording of test results and interpretation of SMBG.;Ketone testing, when, how.    Acute complications  Taught treatment of hypoglycemia - the 15 rule.    Chronic complications  Relationship between chronic complications and blood glucose control    Psychosocial adjustment  Identified and addressed patients feelings and concerns about diabetes    Preconception care  Pregnancy and GDM  Role of pre-pregnancy blood glucose control on the development of the fetus;Role of family planning for patients with diabetes;Reviewed with patient blood glucose goals with pregnancy      Individualized Goals (developed by patient)   Reducing Risk Decrease medications Lose weight Lead a healthier lifestyle Become more fit     Outcomes   Expected Outcomes  Demonstrated interest in learning. Expect positive outcomes    Future DMSE  2 wks       Individualized Plan for Diabetes Self-Management Training:   Learning Objective:  Patient will have a greater understanding of diabetes self-management. Patient education plan is to attend individual and/or group sessions per assessed needs and concerns.   Plan:   Patient Instructions  Read booklet on Gestational Diabetes Follow Gestational Meal Planning Guidelines Avoid fruit juice and sugar sweetened drinks unless treating a low blood sugar Complete a 3 Day Food Record and bring to next appointment Check blood sugars 4 x day - before breakfast and 2 hrs after every meal and record  Bring blood sugar log to all appointments Call MD for prescription for meter strips and lancets Strips One Touch Verio Lancets   One Touch Delica Purchase urine ketone strips if blood sugars not controlled and check urine ketones every am:  If + increase bedtime snack to 1 protein and 2 carbohydrate servings Walk 20-30 minutes at least 5  x week if permitted by MD  Expected Outcomes:  Demonstrated interest in learning. Expect positive outcomes  Education material provided:  Gestational Booklet Gestational Meal Planning Guidelines Simple Meal Plan Viewed Gestational Diabetes Video Meter - One Touch Verio Flex 3 Day Food Record Goals for a Healthy Pregnancy  If problems or questions, patient to contact team via:  Sharion Settler, RN, CCM, CDE [phone]  Future DSME appointment: 2 wks  May 13, 2018 with the dietitian

## 2018-05-13 ENCOUNTER — Ambulatory Visit: Payer: BC Managed Care – PPO | Admitting: Dietician

## 2018-05-14 LAB — OB RESULTS CONSOLE GBS: GBS: NEGATIVE

## 2018-05-14 LAB — OB RESULTS CONSOLE GC/CHLAMYDIA
Chlamydia: NEGATIVE
Gonorrhea: NEGATIVE

## 2018-05-14 LAB — OB RESULTS CONSOLE RPR: RPR: NONREACTIVE

## 2018-05-15 ENCOUNTER — Telehealth: Payer: Self-pay | Admitting: Dietician

## 2018-05-15 NOTE — Telephone Encounter (Signed)
Called patient to reschedule her cancelled appointment from 05/13/18. Patient stated she would have to call back later, as she is currently in another appointment.

## 2018-05-17 ENCOUNTER — Encounter: Payer: Self-pay | Admitting: Dietician

## 2018-05-17 NOTE — Progress Notes (Signed)
Have not heard back from patient to reschedule missed appointment. Sent discharge letter to referring provider.

## 2018-06-03 ENCOUNTER — Other Ambulatory Visit: Payer: Self-pay | Admitting: Obstetrics and Gynecology

## 2018-06-03 NOTE — Progress Notes (Signed)
Orders for admission 

## 2018-06-09 ENCOUNTER — Inpatient Hospital Stay: Admit: 2018-06-09 | Payer: Self-pay

## 2018-06-09 ENCOUNTER — Other Ambulatory Visit: Payer: Self-pay

## 2018-06-09 ENCOUNTER — Inpatient Hospital Stay: Payer: BC Managed Care – PPO | Admitting: Anesthesiology

## 2018-06-09 ENCOUNTER — Inpatient Hospital Stay
Admission: EM | Admit: 2018-06-09 | Discharge: 2018-06-11 | DRG: 806 | Disposition: A | Discharge status: Home / Self Care | Payer: BC Managed Care – PPO | Attending: Obstetrics and Gynecology | Admitting: Obstetrics and Gynecology

## 2018-06-09 ENCOUNTER — Encounter: Payer: Self-pay | Admitting: *Deleted

## 2018-06-09 DIAGNOSIS — Z3490 Encounter for supervision of normal pregnancy, unspecified, unspecified trimester: Secondary | ICD-10-CM | POA: Diagnosis present

## 2018-06-09 DIAGNOSIS — O9081 Anemia of the puerperium: Secondary | ICD-10-CM | POA: Diagnosis not present

## 2018-06-09 DIAGNOSIS — O24419 Gestational diabetes mellitus in pregnancy, unspecified control: Secondary | ICD-10-CM | POA: Diagnosis present

## 2018-06-09 DIAGNOSIS — D62 Acute posthemorrhagic anemia: Secondary | ICD-10-CM | POA: Diagnosis not present

## 2018-06-09 DIAGNOSIS — O99214 Obesity complicating childbirth: Secondary | ICD-10-CM | POA: Diagnosis present

## 2018-06-09 DIAGNOSIS — E039 Hypothyroidism, unspecified: Secondary | ICD-10-CM | POA: Diagnosis present

## 2018-06-09 DIAGNOSIS — E669 Obesity, unspecified: Secondary | ICD-10-CM | POA: Diagnosis present

## 2018-06-09 DIAGNOSIS — O99284 Endocrine, nutritional and metabolic diseases complicating childbirth: Secondary | ICD-10-CM | POA: Diagnosis present

## 2018-06-09 DIAGNOSIS — Z3A40 40 weeks gestation of pregnancy: Secondary | ICD-10-CM | POA: Diagnosis not present

## 2018-06-09 DIAGNOSIS — O24420 Gestational diabetes mellitus in childbirth, diet controlled: Secondary | ICD-10-CM | POA: Diagnosis present

## 2018-06-09 HISTORY — DX: Deficiency of other specified B group vitamins: E53.8

## 2018-06-09 LAB — CBC
HCT: 37.9 % (ref 35.0–47.0)
Hemoglobin: 13 g/dL (ref 12.0–16.0)
MCH: 29.7 pg (ref 26.0–34.0)
MCHC: 34.4 g/dL (ref 32.0–36.0)
MCV: 86.4 fL (ref 80.0–100.0)
PLATELETS: 156 10*3/uL (ref 150–440)
RBC: 4.38 MIL/uL (ref 3.80–5.20)
RDW: 14.3 % (ref 11.5–14.5)
WBC: 11.8 10*3/uL — ABNORMAL HIGH (ref 3.6–11.0)

## 2018-06-09 LAB — TYPE AND SCREEN
ABO/RH(D): A POS
Antibody Screen: NEGATIVE

## 2018-06-09 LAB — GLUCOSE, CAPILLARY
GLUCOSE-CAPILLARY: 67 mg/dL — AB (ref 70–99)
GLUCOSE-CAPILLARY: 83 mg/dL (ref 70–99)

## 2018-06-09 MED ORDER — CALCIUM CARBONATE ANTACID 500 MG PO CHEW
1.0000 | CHEWABLE_TABLET | ORAL | Status: DC | PRN
Start: 2018-06-09 — End: 2018-06-11
  Administered 2018-06-09 – 2018-06-10 (×2): 200 mg via ORAL
  Filled 2018-06-09 (×2): qty 1

## 2018-06-09 MED ORDER — OXYTOCIN 40 UNITS IN LACTATED RINGERS INFUSION - SIMPLE MED: 1.0000 m[IU]/min | INTRAVENOUS | Status: DC

## 2018-06-09 MED ORDER — BUPIVACAINE HCL (PF) 0.25 % IJ SOLN
INTRAMUSCULAR | Status: DC | PRN
  Administered 2018-06-09: 10 mL via EPIDURAL

## 2018-06-09 MED ORDER — SOD CITRATE-CITRIC ACID 500-334 MG/5ML PO SOLN: 30.0000 mL | ORAL | Status: DC | PRN

## 2018-06-09 MED ORDER — LACTATED RINGERS IV SOLN
500.0000 mL | INTRAVENOUS | Status: DC | PRN
  Administered 2018-06-09 (×2): 500 mL via INTRAVENOUS

## 2018-06-09 MED ORDER — OXYTOCIN 40 UNITS IN LACTATED RINGERS INFUSION - SIMPLE MED: 2.5000 [IU]/h | INTRAVENOUS | Status: DC

## 2018-06-09 MED ORDER — LACTATED RINGERS IV SOLN: INTRAVENOUS | Status: DC

## 2018-06-09 MED ORDER — EPHEDRINE 5 MG/ML INJ
10.0000 mg | INTRAVENOUS | Status: DC | PRN
  Filled 2018-06-09: qty 2

## 2018-06-09 MED ORDER — LIDOCAINE HCL (PF) 1 % IJ SOLN
INTRAMUSCULAR | Status: DC | PRN
  Administered 2018-06-09: 3 mL

## 2018-06-09 MED ORDER — MISOPROSTOL 200 MCG PO TABS
ORAL_TABLET | ORAL | Status: AC
  Filled 2018-06-09: qty 1

## 2018-06-09 MED ORDER — LIDOCAINE HCL (PF) 2 % IJ SOLN
INTRAMUSCULAR | Status: DC | PRN
  Administered 2018-06-09: 5 mL via INTRADERMAL

## 2018-06-09 MED ORDER — AMMONIA AROMATIC IN INHA
RESPIRATORY_TRACT | Status: AC
  Filled 2018-06-09: qty 10

## 2018-06-09 MED ORDER — MISOPROSTOL 25 MCG QUARTER TABLET: 25.0000 ug | ORAL_TABLET | ORAL | Status: DC | PRN

## 2018-06-09 MED ORDER — OXYTOCIN 40 UNITS IN LACTATED RINGERS INFUSION - SIMPLE MED
1.0000 m[IU]/min | INTRAVENOUS | Status: DC
  Administered 2018-06-09: 1 m[IU]/min via INTRAVENOUS
  Filled 2018-06-09: qty 1000

## 2018-06-09 MED ORDER — FENTANYL 2.5 MCG/ML W/ROPIVACAINE 0.15% IN NS 100 ML EPIDURAL (ARMC)
12.0000 mL/h | EPIDURAL | Status: DC
  Administered 2018-06-09 (×2): 12 mL/h via EPIDURAL
  Filled 2018-06-09 (×2): qty 100

## 2018-06-09 MED ORDER — LACTATED RINGERS IV SOLN
INTRAVENOUS | Status: DC
  Administered 2018-06-09 (×3): via INTRAVENOUS

## 2018-06-09 MED ORDER — ONDANSETRON HCL 4 MG/2ML IJ SOLN
4.0000 mg | Freq: Four times a day (QID) | INTRAMUSCULAR | Status: DC | PRN
  Administered 2018-06-09: 4 mg via INTRAVENOUS
  Filled 2018-06-09: qty 2

## 2018-06-09 MED ORDER — OXYTOCIN 10 UNIT/ML IJ SOLN
INTRAMUSCULAR | Status: AC
  Filled 2018-06-09: qty 2

## 2018-06-09 MED ORDER — PHENYLEPHRINE 40 MCG/ML (10ML) SYRINGE FOR IV PUSH (FOR BLOOD PRESSURE SUPPORT): 80.0000 ug | PREFILLED_SYRINGE | INTRAVENOUS | Status: DC | PRN

## 2018-06-09 MED ORDER — OXYTOCIN BOLUS FROM INFUSION
500.0000 mL | Freq: Once | INTRAVENOUS | Status: AC
  Administered 2018-06-10: 500 mL via INTRAVENOUS

## 2018-06-09 MED ORDER — LACTATED RINGERS IV SOLN
500.0000 mL | Freq: Once | INTRAVENOUS | Status: AC
  Administered 2018-06-09: 500 mL via INTRAVENOUS

## 2018-06-09 MED ORDER — OXYTOCIN BOLUS FROM INFUSION: 500.0000 mL | Freq: Once | INTRAVENOUS | Status: DC

## 2018-06-09 MED ORDER — DIPHENHYDRAMINE HCL 50 MG/ML IJ SOLN: 12.5000 mg | INTRAMUSCULAR | Status: DC | PRN

## 2018-06-09 MED ORDER — LIDOCAINE HCL (PF) 1 % IJ SOLN
30.0000 mL | INTRAMUSCULAR | Status: DC | PRN
  Administered 2018-06-10: 30 mL via SUBCUTANEOUS
  Filled 2018-06-09: qty 30

## 2018-06-09 MED ORDER — LIDOCAINE-EPINEPHRINE (PF) 1.5 %-1:200000 IJ SOLN
INTRAMUSCULAR | Status: DC | PRN
  Administered 2018-06-09: 3 mL via PERINEURAL

## 2018-06-09 MED ORDER — LACTATED RINGERS IV SOLN: 500.0000 mL | INTRAVENOUS | Status: DC | PRN

## 2018-06-09 MED ORDER — ACETAMINOPHEN 325 MG PO TABS
650.0000 mg | ORAL_TABLET | ORAL | Status: DC | PRN
  Administered 2018-06-10: 650 mg via ORAL
  Filled 2018-06-09: qty 2

## 2018-06-09 MED ORDER — TERBUTALINE SULFATE 1 MG/ML IJ SOLN: 0.2500 mg | Freq: Once | INTRAMUSCULAR | Status: DC | PRN

## 2018-06-09 NOTE — Progress Notes (Addendum)
Kari Moore is a 32 y.o. G1P0 at 6365w0d by  admitted for   Subjective: "I was feeling uncomfortable with pushing as my epidural was wearing off"  Objective: BP 110/70   Pulse 83   Temp 98 F (36.7 C) (Oral)   Resp 16   Ht 5\' 1"  (1.549 m)   Wt 94.8 kg   SpO2 100%   BMI 39.49 kg/m  I/O last 3 completed shifts: In: 3572 [I.V.:72] Out: -  No intake/output data recorded.  FHT:  125, early decels to 80-90 x 30 sec with pushing UC:  q 1.5-2, MVU's cannot be calculated while pushing SVE:   Dilation: 10 Effacement (%): 100 Station: 0 Exam by:: Marvell FullerPamela Rumley,RN  Labs: Lab Results  Component Value Date   WBC 11.8 (H) 06/09/2018   HGB 13.0 06/09/2018   HCT 37.9 06/09/2018   MCV 86.4 06/09/2018   PLT 156 06/09/2018    Assessment / Plan: A:1. IUP at 40 weeks 2. Obesity BMI 39 3. A1GDM P: Will get epidural working so that pt is comfortable and can labor the baby down 2. Continue to monitor fetal and uterine contractions. 3. Antic SVD. Report to Dr Feliberto GottronSchermerhorn and agrees with the plan of care ________________________________ Myrtie Cruisearon W. Niaya Hickok,RN, MSN, CNM, FNP Certified Nurse Midwife [facility]  Sharee Pimplearon W Malayna Noori 06/09/2018, 10:17 PM

## 2018-06-09 NOTE — H&P (Signed)
Bonnee QuinCasey A Westley is a 32 y.o. female G1P0 with LMP of 08/28/17 and EDD of 06/09/18. [facility] presenting for IOL for A1GDM diet controlled with normal glucose readings.  OB History    Gravida  1   Para      Term      Preterm      AB      Living        SAB      TAB      Ectopic      Multiple      Live Births             Past Medical History:  Diagnosis Date  . Allergy   . Gestational diabetes   . Hypothyroidism   . IBS (irritable bowel syndrome)   . Vitamin B 12 deficiency   Scoliosis  Past Surgical History:  Procedure Laterality Date  . CHOLECYSTECTOMY    . CHOLECYSTECTOMY    . COLONOSCOPY    . COLONOSCOPY WITH PROPOFOL N/A 08/17/2016   Procedure: COLONOSCOPY WITH PROPOFOL;  Surgeon: Scot Junobert T Elliott, MD;  [facility];  Service: Endoscopy;  Laterality: N/A;  . ESOPHAGOGASTRODUODENOSCOPY (EGD) WITH PROPOFOL N/A 08/17/2016   Procedure: ESOPHAGOGASTRODUODENOSCOPY (EGD) WITH PROPOFOL;  Surgeon: Scot Junobert T Elliott, MD;  [facility];  Service: Endoscopy;  Laterality: N/A;  . WISDOM TOOTH EXTRACTION    FMH:HTN:? Family member, Mom:pre-ecclampsia/ecclampsia,anthother: ulcers Social: Married, Partner:Adam, Tobacco: never smoker, No rec drug usage. 1 female partner   Maternal Diabetes: 1 h 180 3h: fast: 66, 1 h:164, 2 h: 169, 3 h: 150 Genetic Screening: Neg AFP Maternal Ultrasounds/Referrals: Normal Anatomy scan on 01/10/18 Fetal Ultrasounds or other Referrals:  See other US Maternal Substance Abuse:  Neg  Significant Maternal Medications:  Synthroid 88 mcg qd, B12 injections q 2 weeks Significant Maternal Lab Results:  GBS neg Other Comments:   Review of Systems  Constitutional: Negative.   HENT: Negative.   Eyes: Negative.   Respiratory: Negative.   Cardiovascular: Negative.   Gastrointestinal: Negative.   Genitourinary: Negative.   Musculoskeletal: Negative.   Skin: Negative.   Neurological: Negative.   Endo/Heme/Allergies:  Negative.   Psychiatric/Behavioral: Negative.    History Dilation: 3 Effacement (%): 80 Station: -2 Exam by:: C.Aycen Porreca CNM Blood pressure 118/78, temperature 97.7 F (36.5 C), temperature source Oral, resp. rate 16, height 5\' 1"  (1.549 m), weight 94.8 kg. Exam Physical Exam  Gen:A,A&Ox3 HEENT: Normocephalic, Eyes non-icteric. HEART:S1S2, RRR, No M/R/G LUNGS:CTA bilat, no W/R/R ZOX:WRUEAVABD:Gravid,   Extrems:warm, dry, NT, Neg Homan's  Prenatal labs: ABO, Rh:  A pos Antibody:  Neg  Rubella: Immune (01/28 0000) RPR: Nonreactive (07/23 0000)  HBsAg: Negative (01/28 0000)  HIV: Non-reactive (01/28 0000)  GBS: Negative (07/23 0000)  Varicella:Immune  AROM performed at 1053 for clear bloody fluid, IUPC inserted. UC:q 3-4.5 mins, palp mild FHT:125, accels to 150, mod variability, no decels, Cat 1 strip Cx:3/80%/vtx-2 Assessment/Plan: A:A1GDM well controlled 2. Hypothyroid 3. B12 def 4. IUP at 40 weeks for IOL due to A1GDM 5. GBs neg P:1. Disc risks, benefits and alternatives of IOL including failed and need for LTCS, fetal or uterine intolerance, infection, bleeding and other complications. 2. Start Pitocin per protocol.  3. Continue to monitor UC/FHT's.  ____________________________ Myrtie Cruisearon W. Sahib Pella,RN, MSN, CNM, FNP Certified Nurse Midwife [facility] Sharee Pimplearon W Omya Winfield 06/09/2018, 11:14 AM

## 2018-06-09 NOTE — Discharge Summary (Signed)
Obstetrical Discharge Summary  Patient Name: Kari Moore DOB: 03-22-1986 MRN: [id number]  Date of Admission: 06/09/2018 Date of Delivery: 06/09/18 Delivered by: Milon Scorearon Jones, CNM Date of Discharge: 06/11/2018  [facility] LMP:No LMP recorded. EDC Estimated Date of Delivery: 06/09/18 Gestational Age at Delivery: 8318w0d   Antepartum complications: A1GDM well controlled diet, obesity TWG of 22 pounds Admitting Diagnosis: A1GDM at 40 weeks for IOL due to GDM & Obesity Secondary Diagnosis: Patient Active Problem List   Diagnosis Date Noted  . Encounter for planned induction of labor 06/10/2018  . Gestational diabetes 06/09/2018  . Indication for care in labor and delivery, antepartum 06/09/2018  . Encounter for induction of labor 06/09/2018  . Threatened premature labor affecting pregnancy, less than 37 weeks in second trimester, antepartum 03/20/2018  . NONTRAUMATIC RUPTURE OF QUADRICEPS TENDON 02/04/2009    Augmentation: AROM, Pitocin and IUPC Complications: None Intrapartum complications/course:  Date of Delivery: 06/10/18 Delivered By: Milon Scorearon Jones, CNM Delivery Type:NSVD of viable female Anesthesia: epidural Placenta: spontaneous Laceration: 2nd degree perineal repaired Episiotomy: none  Newborn Data: Female infant 06/09/18 at 2358 apgars 7/9 Wt: 6 lb 13.7 oz   End stage Meconium noted    Postpartum Procedures: N/A  Post partum course: Stable  Patient had an uncomplicated postpartum course.  By time of discharge on PPD#2, her pain was controlled on oral pain medications; she had appropriate lochia and was ambulating, voiding without difficulty and tolerating regular diet.  She was deemed stable for discharge to home.    Discharge Physical Exam: BP 114/74 (BP Location: Right Arm)   Pulse 71   Temp 97.8 F (36.6 C) (Oral)   Resp 18   Ht 5\' 1"  (1.549 m)   Wt 94.8 kg   SpO2 96%   Breastfeeding? Unknown   BMI 39.49 kg/m  Afebrile > 24hrs.    General: NAD CV: RRR Pulm: CTABL, nl effort ABD: s/nd/nt, fundus firm, NONTENDER and at the umbilicus Lochia: small DVT Evaluation: LE non-ttp, no evidence of DVT on exam.  Hemoglobin  Date Value Ref Range Status  06/11/2018 9.8 (L) 12.0 - 16.0 g/dL Final   HGB  Date Value Ref Range Status  08/19/2014 15.0 12.0 - 16.0 g/dL Final   HCT  Date Value Ref Range Status  06/11/2018 28.8 (L) 35.0 - 47.0 % Final  08/19/2014 45.6 35.0 - 47.0 % Final     Disposition: stable, discharge to home. Baby Feeding: breastmilk Baby Disposition: home with mom  Rh Immune globulin given: N/A Rubella vaccine given: Immune Tdap vaccine given in AP or PP setting: 04/11/18 Flu vaccine given in AP or PP setting:   Contraception: Plans Micronor pp  Prenatal Labs:  A pos, Antibody neg, RI, RPR NR, HbSag neg, HIV NR, GBs neg, Low B12   Plan:  Kari Moore was discharged to home in good condition. Follow-up appointment with delivering provider in 6 weeks.  Discharge Medications: Snynthroid 88 mcg daily, Prenatal vits, Fe, Ibuprofen  Follow-up Information    Sharee PimpleJones, Caron W, CNM. Schedule an appointment as soon as possible for a visit in 6 week(s).   Specialty:  Obstetrics and Gynecology Contact information: [address] [facility] [place] [address] [phone]         ____________________________________  Signed: Dala DockMcVey, Jamarian Jacinto A, CNM 06/11/2018 11:32 AM

## 2018-06-09 NOTE — Progress Notes (Signed)
Bonnee QuinCasey A Condron is a 32 y.o. G1P0 at 3677w0d by  admitted for IOL for A1GDM.   Subjective: "I am comfortable"   Objective: BP 124/74   Pulse 71   Temp 98.3 F (36.8 C) (Oral)   Resp 16   Ht 5\' 1"  (1.549 m)   Wt 94.8 kg   BMI 39.49 kg/m  No intake/output data recorded. Total I/O In: 12 [I.V.:12] Out: -   FHT: 120, +accels, no decels UC: q 2-3 mins,  SVE:   Dilation: 5 Effacement (%): 90 Station: -2 Exam by:: c jones cnm  Labs: Lab Results  Component Value Date   WBC 11.8 (H) 06/09/2018   HGB 13.0 06/09/2018   HCT 37.9 06/09/2018   MCV 86.4 06/09/2018   PLT 156 06/09/2018    Assessment / Plan: 1. IUP at 40 weeks 2 A1GDM well controlled P:1. Continue to labor 2 IUPC with 150 MVU's 3. Continue epidural. 4. Dr Feliberto GottronSchermerhorn given report 30 mins ago and aware and agrees with the plan of care.  ________________________ Myrtie Cruisearon W. Jones,RN, MSN, CNM, FNP Certified Nurse Midwife [facility]  Sharee Pimplearon W Jones 06/09/2018, 1:54 PM

## 2018-06-09 NOTE — Anesthesia Procedure Notes (Signed)
Epidural Patient location during procedure: OB Start time: 06/09/2018 1:48 PM End time: 06/09/2018 2:00 PM  Staffing Anesthesiologist: Yves Dillarroll, Ione [place], MD Performed: anesthesiologist   Preanesthetic Checklist Completed: patient identified, site marked, surgical consent, pre-op evaluation, timeout performed, IV checked, risks and benefits discussed and monitors and equipment checked  Epidural Patient position: sitting Prep: Betadine Patient monitoring: heart rate, continuous pulse ox and blood pressure Approach: midline Location: L3-L4 Injection technique: LOR air  Needle:  Needle type: Tuohy  Needle gauge: 17 G Needle length: 9 cm and 9 Catheter type: closed end flexible Catheter size: 19 Gauge Test dose: negative and 1.5% lidocaine with Epi 1:200 K  Assessment Events: blood not aspirated, injection not painful, no injection resistance, negative IV test and no paresthesia  Additional Notes Time out called.  Patient placed in sitting position.  Back prepped and draped in sterile fashion.  A skin wheal was made in the L3-L4 interspace with 1% Lidocaine plain.  A 17G Tuohy needle was guided into the epidural space by a loss of resistance technique.  No blood or paresthesias.  The epidural catheter was threaded 3 cm and the TD was negative.  The catheter was affixed to the back in sterile fashion and the patient tolerated the procedure well. Reason for block:procedure for pain

## 2018-06-09 NOTE — Anesthesia Preprocedure Evaluation (Signed)
Anesthesia Evaluation  Patient identified by MRN, date of birth, ID band Patient awake    Reviewed: Allergy & Precautions, NPO status , Patient's Chart, lab work & pertinent test results  Airway Mallampati: II  TM Distance: >3 FB     Dental   Pulmonary neg pulmonary ROS,    Pulmonary exam normal        Cardiovascular negative cardio ROS Normal cardiovascular exam     Neuro/Psych negative neurological ROS  negative psych ROS   GI/Hepatic Neg liver ROS,   Endo/Other  diabetes, GestationalHypothyroidism   Renal/GU negative Renal ROS  negative genitourinary   Musculoskeletal scoliosis   Abdominal Normal abdominal exam  (+)   Peds negative pediatric ROS (+)  Hematology negative hematology ROS (+)   Anesthesia Other Findings Past Medical History: No date: Allergy No date: Gestational diabetes No date: Hypothyroidism No date: IBS (irritable bowel syndrome) No date: Vitamin B 12 deficiency  Reproductive/Obstetrics                             Anesthesia Physical Anesthesia Plan  ASA: II  Anesthesia Plan: Epidural   Post-op Pain Management:    Induction:   PONV Risk Score and Plan:   Airway Management Planned: Natural Airway  Additional Equipment:   Intra-op Plan:   Post-operative Plan:   Informed Consent: I have reviewed the patients History and Physical, chart, labs and discussed the procedure including the risks, benefits and alternatives for the proposed anesthesia with the patient or authorized representative who has indicated his/her understanding and acceptance.   Dental advisory given  Plan Discussed with: CRNA and Surgeon  Anesthesia Plan Comments:         Anesthesia Quick Evaluation

## 2018-06-09 NOTE — OB Triage Note (Signed)
[facility] controlled.

## 2018-06-09 NOTE — Progress Notes (Signed)
Patient ID: Bonnee Quinasey A Thivierge, female   DOB: Aug 29, 1986, 32 y.o.   MRN: [id number] S: Resting comfortably with  epidural. + CTX, no LOF,+bloody show noted O: Vitals:   06/09/18 1810 06/09/18 1815 06/09/18 1820 06/09/18 1825  BP:    104/61  Pulse:    64  Resp:      Temp:      TempSrc:      SpO2: 100% 100% 100% 100%  Weight:      Height:         Gen: NAD, AAOx3      Abd: FNTTP      Ext: Non-tender, Nonedmeatous   FHT: + mod var + accelerations, +early decels with recovery, Cat 2 TOCO: Q2-2.5  Min, MVU"s 270, 15 resting tone SVE: 9/100/vtx-1   A/P:  32 y.o. yo G1P0 at 7413w0d for .   Labor: Using peanut ball with positioning  FWB: Reassuring Cat 1 tracing. EFW   GBS: neg  Contraception: Micronor  Dr Feliberto GottronSchermerhorn is updated with pt status.   Continue Pitocin per protocol   Sharee Pimplearon W Sanjiv Castorena 6:42 PM

## 2018-06-10 ENCOUNTER — Encounter: Payer: Self-pay | Admitting: *Deleted

## 2018-06-10 DIAGNOSIS — Z3490 Encounter for supervision of normal pregnancy, unspecified, unspecified trimester: Secondary | ICD-10-CM | POA: Diagnosis present

## 2018-06-10 LAB — CBC WITH DIFFERENTIAL/PLATELET
Basophils Absolute: 0 10*3/uL (ref 0–0.1)
Basophils Relative: 0 %
Eosinophils Absolute: 0 10*3/uL (ref 0–0.7)
Eosinophils Relative: 0 %
HCT: 31.5 % — ABNORMAL LOW (ref 35.0–47.0)
Hemoglobin: 10.9 g/dL — ABNORMAL LOW (ref 12.0–16.0)
Lymphocytes Relative: 5 %
Lymphs Abs: 1.3 10*3/uL (ref 1.0–3.6)
MCH: 30.1 pg (ref 26.0–34.0)
MCHC: 34.7 g/dL (ref 32.0–36.0)
MCV: 86.9 fL (ref 80.0–100.0)
Monocytes Absolute: 1.5 10*3/uL — ABNORMAL HIGH (ref 0.2–0.9)
Monocytes Relative: 5 %
Neutro Abs: 25.5 10*3/uL — ABNORMAL HIGH (ref 1.4–6.5)
Neutrophils Relative %: 90 %
Platelets: 154 10*3/uL (ref 150–440)
RBC: 3.63 MIL/uL — ABNORMAL LOW (ref 3.80–5.20)
RDW: 14 % (ref 11.5–14.5)
WBC: 28.4 10*3/uL — ABNORMAL HIGH (ref 3.6–11.0)

## 2018-06-10 LAB — CBC
HCT: 30.2 % — ABNORMAL LOW (ref 35.0–47.0)
Hemoglobin: 10.3 g/dL — ABNORMAL LOW (ref 12.0–16.0)
MCH: 29.6 pg (ref 26.0–34.0)
MCHC: 34 g/dL (ref 32.0–36.0)
MCV: 87 fL (ref 80.0–100.0)
PLATELETS: 158 10*3/uL (ref 150–440)
RBC: 3.47 MIL/uL — AB (ref 3.80–5.20)
RDW: 14.2 % (ref 11.5–14.5)
WBC: 24.8 10*3/uL — AB (ref 3.6–11.0)

## 2018-06-10 MED ORDER — WITCH HAZEL-GLYCERIN EX PADS: 1.0000 "application " | MEDICATED_PAD | CUTANEOUS | Status: DC | PRN

## 2018-06-10 MED ORDER — SIMETHICONE 80 MG PO CHEW: 80.0000 mg | CHEWABLE_TABLET | ORAL | Status: DC | PRN

## 2018-06-10 MED ORDER — SODIUM CHLORIDE 0.9% FLUSH
3.0000 mL | INTRAVENOUS | Status: DC | PRN
  Administered 2018-06-11: 3 mL via INTRAVENOUS
  Filled 2018-06-10: qty 3

## 2018-06-10 MED ORDER — ONDANSETRON HCL 4 MG/2ML IJ SOLN: 4.0000 mg | INTRAMUSCULAR | Status: DC | PRN

## 2018-06-10 MED ORDER — BISACODYL 10 MG RE SUPP: 10.0000 mg | Freq: Every day | RECTAL | Status: DC | PRN

## 2018-06-10 MED ORDER — IBUPROFEN 600 MG PO TABS
600.0000 mg | ORAL_TABLET | Freq: Four times a day (QID) | ORAL | Status: DC
Start: 2018-06-10 — End: 2018-06-11
  Administered 2018-06-10 – 2018-06-11 (×7): 600 mg via ORAL
  Filled 2018-06-10 (×6): qty 1

## 2018-06-10 MED ORDER — DIBUCAINE 1 % RE OINT: 1.0000 "application " | TOPICAL_OINTMENT | RECTAL | Status: DC | PRN

## 2018-06-10 MED ORDER — FLEET ENEMA 7-19 GM/118ML RE ENEM: 1.0000 | ENEMA | Freq: Every day | RECTAL | Status: DC | PRN

## 2018-06-10 MED ORDER — SENNOSIDES-DOCUSATE SODIUM 8.6-50 MG PO TABS
2.0000 | ORAL_TABLET | ORAL | Status: DC
  Administered 2018-06-10 – 2018-06-11 (×2): 2 via ORAL
  Filled 2018-06-10 (×2): qty 2

## 2018-06-10 MED ORDER — BENZOCAINE-MENTHOL 20-0.5 % EX AERO
1.0000 "application " | INHALATION_SPRAY | CUTANEOUS | Status: DC | PRN
  Filled 2018-06-10: qty 56

## 2018-06-10 MED ORDER — IBUPROFEN 600 MG PO TABS
ORAL_TABLET | ORAL | Status: AC
  Filled 2018-06-10: qty 1

## 2018-06-10 MED ORDER — SODIUM CHLORIDE 0.9 % IV SOLN
250.0000 mL | INTRAVENOUS | Status: DC | PRN
  Administered 2018-06-10: 250 mL via INTRAVENOUS

## 2018-06-10 MED ORDER — SODIUM CHLORIDE 0.9% FLUSH
3.0000 mL | Freq: Two times a day (BID) | INTRAVENOUS | Status: DC
  Administered 2018-06-10: 3 mL via INTRAVENOUS

## 2018-06-10 MED ORDER — MISOPROSTOL 200 MCG PO TABS: 800.0000 ug | ORAL_TABLET | Freq: Once | ORAL | Status: DC

## 2018-06-10 MED ORDER — MISOPROSTOL 200 MCG PO TABS
800.0000 ug | ORAL_TABLET | Freq: Once | ORAL | Status: AC
  Administered 2018-06-10: 800 ug via RECTAL

## 2018-06-10 MED ORDER — GENTAMICIN SULFATE 40 MG/ML IJ SOLN
5.0000 mg/kg | INTRAMUSCULAR | Status: DC
  Administered 2018-06-10: 470 mg via INTRAVENOUS
  Filled 2018-06-10 (×2): qty 11.75

## 2018-06-10 MED ORDER — COCONUT OIL OIL: 1.0000 "application " | TOPICAL_OIL | Status: DC | PRN

## 2018-06-10 MED ORDER — ONDANSETRON HCL 4 MG PO TABS: 4.0000 mg | ORAL_TABLET | ORAL | Status: DC | PRN

## 2018-06-10 MED ORDER — CLINDAMYCIN PHOSPHATE 900 MG/50ML IV SOLN
900.0000 mg | Freq: Three times a day (TID) | INTRAVENOUS | Status: DC
  Administered 2018-06-10 – 2018-06-11 (×3): 900 mg via INTRAVENOUS
  Filled 2018-06-10 (×7): qty 50

## 2018-06-10 MED ORDER — PRENATAL MULTIVITAMIN CH
1.0000 | ORAL_TABLET | Freq: Every day | ORAL | Status: DC
  Administered 2018-06-10 – 2018-06-11 (×2): 1 via ORAL
  Filled 2018-06-10 (×2): qty 1

## 2018-06-10 MED ORDER — ZOLPIDEM TARTRATE 5 MG PO TABS: 5.0000 mg | ORAL_TABLET | Freq: Every evening | ORAL | Status: DC | PRN

## 2018-06-10 MED ORDER — ACETAMINOPHEN 325 MG PO TABS
650.0000 mg | ORAL_TABLET | ORAL | Status: DC | PRN
  Administered 2018-06-10 – 2018-06-11 (×4): 650 mg via ORAL
  Filled 2018-06-10 (×4): qty 2

## 2018-06-10 MED ORDER — DIPHENHYDRAMINE HCL 25 MG PO CAPS: 25.0000 mg | ORAL_CAPSULE | Freq: Four times a day (QID) | ORAL | Status: DC | PRN

## 2018-06-10 NOTE — Lactation Note (Signed)
This note was copied from a baby's chart. 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  [name] was sleepy for this feeding.  Demonstrated waking techniques.  Once we got him awake and skin to skin with mom, he began strong rhythmic sucking with occasional swallows.  Demonstrated massaging of breast and stimulation of [place] to keep him awake with nutritive sucking.  Mom experiencing nipple discomfort especially on initial latch.  Demonstrated hand expression of colostrum to rub into nipples.  Mom already has coconut oil and Soothies gel pads for nipples.  Mom has scar on inner side of left breast at chest wall from removal of mylenoma which should not interfere with any ducts.  Reviewed supply and demand, routine newborn feeding patterns and normal course of lactation.  Parents had lots of breast feeding questions which were addressed.  Mom has [employer] insurance.  Discussed process of getting DEBP through insurance.  Lactation name and number on white board and encouraged to call with any questions, concerns or assistance.   Maternal Data Formula Feeding for Exclusion: No Has patient been taught Hand Expression?: Yes(Can easily hand express colostrum) Does the patient have breastfeeding experience prior to this delivery?: No  Feeding Feeding Type: Breast Fed  LATCH Score Latch: Repeated attempts needed to sustain latch, nipple held in mouth throughout feeding, stimulation needed to elicit sucking reflex.  Audible Swallowing: A few with stimulation  Type of Nipple: Everted at rest and after stimulation  Comfort (Breast/Nipple): Filling, red/small blisters or bruises, mild/mod discomfort  Hold (Positioning):  Assistance needed to correctly position infant at breast and maintain latch.  LATCH Score: 6  Interventions Interventions: Breast feeding basics reviewed;Reverse pressure;Assisted with latch;Breast compression;Coconut oil;Skin to skin;Adjust position;Breast massage;Support pillows;Hand express;Position options(Nursing stool utilized)  Lactation Tools Discussed/Used Tools: Coconut oil;Other (comment)(Mom brought her own Soothies brand gel pads & nursing stool) WIC Program: No(BCBS)   Consult Status Consult Status: Follow-up Date: 06/10/18 Follow-up type: Call as needed    [name], [name] 06/10/2018, 3:37 PM

## 2018-06-10 NOTE — Progress Notes (Signed)
Post Partum Day 1 Subjective: uterine tenderness, fever overnight. Normal urination. Breastfeeding. Feels subjectively warm, but no chills  Objective: Blood pressure 104/66, pulse 78, temperature 98.7 F (37.1 C), temperature source Oral, resp. rate 18, height 5\' 1"  (1.549 m), weight 94.8 kg, SpO2 97 %, unknown if currently breastfeeding.  Physical Exam:  General: alert, cooperative and appears stated age Lochia: appropriate Uterine Fundus: firm and tender, uterine tenderness throughout.  DVT Evaluation: No evidence of DVT seen on physical exam.  Recent Labs    06/09/18 1003 06/10/18 0521  HGB 13.0 10.9*  HCT 37.9 31.5*  WBC                   11.8   28.4             Assessment/Plan: Breastfeeding and Lactation consult  Postpartum endometritis based on WBC, fever and uterine tenderness:  - amp and gent started, plan for repeat WBC in am - Repeat WBC now  Acute blood loss anemia - po iron and vitamin C   LOS: 1 day   Christeen DouglasBethany Merida Alcantar 06/10/2018, 12:59 PM

## 2018-06-10 NOTE — Anesthesia Postprocedure Evaluation (Signed)
Anesthesia Post Note  Patient: Kari Moore A Sur  Procedure(s) Performed: AN AD HOC LABOR EPIDURAL  Patient location during evaluation: Mother Baby Anesthesia Type: Epidural Level of consciousness: awake and alert Pain management: pain level controlled Vital Signs Assessment: post-procedure vital signs reviewed and stable Respiratory status: spontaneous breathing, nonlabored ventilation and respiratory function stable Cardiovascular status: stable Postop Assessment: no headache, no backache and epidural receding Anesthetic complications: no     Last Vitals:  Vitals:   06/10/18 0827 06/10/18 0831  BP: 111/65   Pulse: 76   Resp: 18   Temp: 36.6 C 37.1 C  SpO2: 98%     Last Pain:  Vitals:   06/10/18 0831  TempSrc: Axillary  PainSc:                  Rica MastBachich,  Zylie Mumaw M

## 2018-06-11 LAB — CBC WITH DIFFERENTIAL/PLATELET
BASOS ABS: 0.1 10*3/uL (ref 0–0.1)
BASOS PCT: 0 %
Eosinophils Absolute: 0.2 10*3/uL (ref 0–0.7)
Eosinophils Relative: 1 %
HEMATOCRIT: 28.8 % — AB (ref 35.0–47.0)
HEMOGLOBIN: 9.8 g/dL — AB (ref 12.0–16.0)
LYMPHS PCT: 14 %
Lymphs Abs: 2.6 10*3/uL (ref 1.0–3.6)
MCH: 29.9 pg (ref 26.0–34.0)
MCHC: 34.1 g/dL (ref 32.0–36.0)
MCV: 87.6 fL (ref 80.0–100.0)
MONOS PCT: 5 %
Monocytes Absolute: 0.8 10*3/uL (ref 0.2–0.9)
NEUTROS ABS: 14.4 10*3/uL — AB (ref 1.4–6.5)
Neutrophils Relative %: 80 %
Platelets: 170 10*3/uL (ref 150–440)
RBC: 3.29 MIL/uL — ABNORMAL LOW (ref 3.80–5.20)
RDW: 14.4 % (ref 11.5–14.5)
WBC: 18.1 10*3/uL — ABNORMAL HIGH (ref 3.6–11.0)

## 2018-06-11 LAB — HIV ANTIBODY (ROUTINE TESTING W REFLEX): HIV Screen 4th Generation wRfx: NONREACTIVE

## 2018-06-11 LAB — RPR: RPR Ser Ql: NONREACTIVE

## 2018-06-11 MED ORDER — ACETAMINOPHEN 325 MG PO TABS: 650.0000 mg | ORAL_TABLET | ORAL | Status: DC | PRN

## 2018-06-11 MED ORDER — FERROUS SULFATE 325 (65 FE) MG PO TABS: 325.0000 mg | ORAL_TABLET | Freq: Two times a day (BID) | ORAL | 3 refills | Status: DC

## 2018-06-11 MED ORDER — IBUPROFEN 600 MG PO TABS: 600.0000 mg | ORAL_TABLET | Freq: Four times a day (QID) | ORAL | 0 refills | Status: DC

## 2018-06-11 MED ORDER — AMOXICILLIN-POT CLAVULANATE 875-125 MG PO TABS: 1.0000 | ORAL_TABLET | Freq: Two times a day (BID) | ORAL | 0 refills | Status: AC

## 2018-06-11 NOTE — Progress Notes (Signed)
Provided and reviewed discharge paperwork and prescriptions. Verified understanding by use of teach back method, pt verbalized understanding as well. Follow up appointment provided. Discharged to go home with infant to be transported by husband.

## 2018-06-11 NOTE — Lactation Note (Signed)
This note was copied from a baby's chart. Lactation Consultation Note  Patient Name: Kari Sonny MastersCasey Moore Today's Date: 06/11/2018    During LC rounds, Mom states baby is breastfeeding "well" today. Parents had questions about milk supply and when/how to introduce bottle later on. We had that discussion, I showed her info in her BF booklet, and encouraged her to attend [employer] to get more info and support.    Maternal Data    Feeding Feeding Type: Breast Fed Length of feed: 30 min  LATCH Score                   Interventions    Lactation Tools Discussed/Used     Consult Status      Sunday CornSandra Clark Jessaca Philippi 06/11/2018, 1:46 PM

## 2018-06-11 NOTE — Progress Notes (Signed)
Post Partum Day 2 Subjective: Doing well, no complaints.  Tolerating regular diet, pain with PO meds, voiding and ambulating without difficulty.  No CP SOB Fever,Chills, N/V or leg pain; denies nipple or breast pain; no HA change of vision, RUQ/epigastric pain  Objective: BP 114/74 (BP Location: Right Arm)   Pulse 71   Temp 97.8 F (36.6 C) (Oral)   Resp 18   Ht 5\' 1"  (1.549 m)   Wt 94.8 kg   SpO2 96%   Breastfeeding? Unknown   BMI 39.49 kg/m   Afebrile > 24hrs   Physical Exam:  General: NAD Breasts: soft/nontender CV: RRR Pulm: nl effort, CTABL Abdomen: soft, NT, BS x 4 Perineum: minimal edema, repair well approximated Lochia: small Uterine Fundus: fundus firm and at umbilicus DVT Evaluation: no cords, ttp LEs   Recent Labs    06/10/18 1347 06/11/18 0823  HGB 10.3* 9.8*  HCT 30.2* 28.8*  WBC 24.8* 18.1*  PLT 158 170    Assessment/Plan: 31 y.o. G1P1001 postpartum day # 2  - Continue routine PP care- d/w Dr Dalbert GarnetBeasley, will dc IV ABx, and dc home on Augmentin due to PP endometritis.  - Lactation consult encouraged, pt asking about pumping.   - Discussed contraceptive options, pt plans mini-pill - Acute blood loss anemia - hemodynamically stable and asymptomatic; start po ferrous sulfate BID with stool softeners; leukocytosis, resolving.  - Immunization status:  all Imms up to date    Disposition: Does desire Dc home today.     Delson Dulworth A, CNM 06/11/2018 11:29 AM

## 2018-10-21 DIAGNOSIS — M419 Scoliosis, unspecified: Secondary | ICD-10-CM | POA: Insufficient documentation

## 2019-09-30 ENCOUNTER — Encounter: Payer: Self-pay | Admitting: Obstetrics and Gynecology

## 2019-10-13 DIAGNOSIS — Z3480 Encounter for supervision of other normal pregnancy, unspecified trimester: Secondary | ICD-10-CM | POA: Insufficient documentation

## 2019-10-24 NOTE — L&D Delivery Note (Signed)
Delivery Note  Kari Moore is a G2P1001 at [redacted]w[redacted]d with an LMP of 07/17/2019, inconsistent with [place] at [redacted]w[redacted]d.   First Stage: Labor onset: 0945 Augmentation : AROM Analgesia /Anesthesia intrapartum: Epidural AROM at 1344  Second Stage: Complete dilation at 1500 Onset of pushing at 1504 FHR second stage: prolong deceleration with progression to c/c/+2.  Baseline returned to 110bpm with moderate variability, earlies and variables with contractions.   Kari Moore presented to L&D with regular, painful contractions that became more intense throughout the morning.  She was expectantly managed and progressed to C/C/+2 with spontaneous urge to push.   Delivery of a viable baby boy 05/02/2020 at 1525 by CNM Delivery of fetal head in OA position with restitution to LOT. Tight nuchal cord x 1 noted;  Anterior then posterior shoulders delivered easily with gentle downward traction. Infant was then somersaulted through the nuchal cord.  Baby placed on mom's chest, and attended to by baby nurse.  Large amount of meconium stained fluid was noted at delivery.   Cord double clamped after cessation of pulsation, cut by father of baby.   Cord blood sample collected: not indicated, A Pos   Third Stage: Oxytocin bolus started after delivery of infant for hemorrhage prophylaxis  Placenta delivered intact with 3 VC @ 1530 Placenta disposition: discarded  Uterine tone firm / bleeding moderate  1st vaginal laceration identified - hemostatic  Anesthesia for repair: N/A Repair: not done  Est. Blood Loss (mL): 100 ml  Complications: none   Mom to postpartum.  Baby to Couplet care / Skin to Skin.  Newborn: Birth Weight: 6lbs 5.6oz, 2880g  Apgar Scores: 8/9 Feeding planned: Breast   ---------- Margaretmary Eddy, CNM Certified Nurse Midwife [place]

## 2020-04-29 ENCOUNTER — Other Ambulatory Visit: Payer: Self-pay

## 2020-04-29 NOTE — Progress Notes (Signed)
G1P1001 with at [redacted]w[redacted]d, dated by LMP of 07/17/2019, c/w early [place] at [redacted]w[redacted]d.  Scheduled for induction of labor for elective at term on 05/04/2020.   Prenatal provider: [facility] Pregnancy complicated by: 1. Hypothyroidism 2. BMI > 40 3. S<D at [redacted]w[redacted]d - 03/31/2020 EFW 2630g, 5lbs 13oz, AFI 18.4cm  Prenatal Labs: Blood type/Rh A pos  Antibody screen neg  Rubella Immune  Varicella Immune  RPR NR  HBsAg Neg  HIV NR  GC neg  Chlamydia neg  Genetic screening AFP negative  1 hour GTT 133  3 hour GTT N/A  GBS Neg    Tdap: given 01/29/2020 Flu: given 06/2020 Contraception: vasectomy  Feeding preference: breast   ____ Margaretmary Eddy, CNM Certified Nurse Midwife [place]  [facility]

## 2020-04-30 ENCOUNTER — Other Ambulatory Visit
Admission: RE | Admit: 2020-04-30 | Discharge: 2020-04-30 | Disposition: A | Discharge status: Home / Self Care | Payer: BC Managed Care – PPO | Source: Ambulatory Visit

## 2020-04-30 ENCOUNTER — Other Ambulatory Visit: Payer: Self-pay

## 2020-04-30 DIAGNOSIS — Z20822 Contact with and (suspected) exposure to covid-19: Secondary | ICD-10-CM | POA: Insufficient documentation

## 2020-04-30 DIAGNOSIS — Z01812 Encounter for preprocedural laboratory examination: Secondary | ICD-10-CM | POA: Insufficient documentation

## 2020-05-01 LAB — SARS CORONAVIRUS 2 (TAT 6-24 HRS): SARS Coronavirus 2: NEGATIVE

## 2020-05-02 ENCOUNTER — Encounter: Payer: Self-pay | Admitting: Obstetrics and Gynecology

## 2020-05-02 ENCOUNTER — Other Ambulatory Visit: Payer: Self-pay

## 2020-05-02 ENCOUNTER — Inpatient Hospital Stay
Admission: EM | Admit: 2020-05-02 | Discharge: 2020-05-03 | DRG: 807 | Disposition: A | Discharge status: Home / Self Care | Payer: BC Managed Care – PPO

## 2020-05-02 ENCOUNTER — Inpatient Hospital Stay: Payer: BC Managed Care – PPO | Admitting: Anesthesiology

## 2020-05-02 DIAGNOSIS — Z3A40 40 weeks gestation of pregnancy: Secondary | ICD-10-CM | POA: Diagnosis not present

## 2020-05-02 DIAGNOSIS — I952 Hypotension due to drugs: Secondary | ICD-10-CM | POA: Diagnosis present

## 2020-05-02 DIAGNOSIS — O479 False labor, unspecified: Secondary | ICD-10-CM | POA: Diagnosis present

## 2020-05-02 DIAGNOSIS — O9A22 Injury, poisoning and certain other consequences of external causes complicating childbirth: Secondary | ICD-10-CM | POA: Diagnosis present

## 2020-05-02 DIAGNOSIS — Z20822 Contact with and (suspected) exposure to covid-19: Secondary | ICD-10-CM | POA: Diagnosis present

## 2020-05-02 DIAGNOSIS — O26893 Other specified pregnancy related conditions, third trimester: Secondary | ICD-10-CM | POA: Diagnosis present

## 2020-05-02 DIAGNOSIS — T413X5A Adverse effect of local anesthetics, initial encounter: Secondary | ICD-10-CM | POA: Diagnosis present

## 2020-05-02 DIAGNOSIS — O99284 Endocrine, nutritional and metabolic diseases complicating childbirth: Secondary | ICD-10-CM | POA: Diagnosis present

## 2020-05-02 DIAGNOSIS — E039 Hypothyroidism, unspecified: Secondary | ICD-10-CM | POA: Diagnosis present

## 2020-05-02 DIAGNOSIS — Z3490 Encounter for supervision of normal pregnancy, unspecified, unspecified trimester: Secondary | ICD-10-CM | POA: Diagnosis present

## 2020-05-02 LAB — CBC
HCT: 39.7 % (ref 36.0–46.0)
Hemoglobin: 13.2 g/dL (ref 12.0–15.0)
MCH: 27.5 pg (ref 26.0–34.0)
MCHC: 33.2 g/dL (ref 30.0–36.0)
MCV: 82.7 fL (ref 80.0–100.0)
Platelets: 200 10*3/uL (ref 150–400)
RBC: 4.8 MIL/uL (ref 3.87–5.11)
RDW: 14.2 % (ref 11.5–15.5)
WBC: 12 10*3/uL — ABNORMAL HIGH (ref 4.0–10.5)
nRBC: 0 % (ref 0.0–0.2)

## 2020-05-02 LAB — TYPE AND SCREEN
ABO/RH(D): A POS
Antibody Screen: NEGATIVE

## 2020-05-02 MED ORDER — LEVOTHYROXINE SODIUM 88 MCG PO TABS
88.0000 ug | ORAL_TABLET | Freq: Every day | ORAL | Status: DC
  Administered 2020-05-03: 88 ug via ORAL
  Filled 2020-05-02 (×2): qty 1

## 2020-05-02 MED ORDER — BENZOCAINE-MENTHOL 20-0.5 % EX AERO: 1.0000 "application " | INHALATION_SPRAY | CUTANEOUS | Status: DC | PRN

## 2020-05-02 MED ORDER — DIPHENHYDRAMINE HCL 25 MG PO CAPS: 25.0000 mg | ORAL_CAPSULE | Freq: Four times a day (QID) | ORAL | Status: DC | PRN

## 2020-05-02 MED ORDER — FENTANYL 2.5 MCG/ML W/ROPIVACAINE 0.15% IN NS 100 ML EPIDURAL (ARMC)
EPIDURAL | Status: DC | PRN
  Administered 2020-05-02: 12 mL/h via EPIDURAL

## 2020-05-02 MED ORDER — SIMETHICONE 80 MG PO CHEW: 80.0000 mg | CHEWABLE_TABLET | ORAL | Status: DC | PRN

## 2020-05-02 MED ORDER — FENTANYL CITRATE (PF) 100 MCG/2ML IJ SOLN: 50.0000 ug | INTRAMUSCULAR | Status: DC | PRN

## 2020-05-02 MED ORDER — OXYTOCIN 10 UNIT/ML IJ SOLN
INTRAMUSCULAR | Status: AC
  Filled 2020-05-02: qty 2

## 2020-05-02 MED ORDER — CALCIUM CARBONATE ANTACID 500 MG PO CHEW: 2.0000 | CHEWABLE_TABLET | ORAL | Status: DC | PRN

## 2020-05-02 MED ORDER — MISOPROSTOL 200 MCG PO TABS
ORAL_TABLET | ORAL | Status: AC
  Filled 2020-05-02: qty 4

## 2020-05-02 MED ORDER — EPHEDRINE 5 MG/ML INJ
10.0000 mg | INTRAVENOUS | Status: DC | PRN
  Filled 2020-05-02: qty 4

## 2020-05-02 MED ORDER — ACETAMINOPHEN 500 MG PO TABS
1000.0000 mg | ORAL_TABLET | Freq: Four times a day (QID) | ORAL | Status: DC | PRN
  Administered 2020-05-02 – 2020-05-03 (×2): 1000 mg via ORAL
  Filled 2020-05-02 (×2): qty 2

## 2020-05-02 MED ORDER — ONDANSETRON HCL 4 MG/2ML IJ SOLN
4.0000 mg | Freq: Four times a day (QID) | INTRAMUSCULAR | Status: DC | PRN
  Administered 2020-05-02: 4 mg via INTRAVENOUS
  Filled 2020-05-02: qty 2

## 2020-05-02 MED ORDER — OXYTOCIN-SODIUM CHLORIDE 30-0.9 UT/500ML-% IV SOLN
INTRAVENOUS | Status: AC
  Filled 2020-05-02: qty 500

## 2020-05-02 MED ORDER — LACTATED RINGERS IV SOLN: 500.0000 mL | Freq: Once | INTRAVENOUS | Status: DC

## 2020-05-02 MED ORDER — BENZOCAINE-MENTHOL 20-0.5 % EX AERO
INHALATION_SPRAY | CUTANEOUS | Status: AC
  Filled 2020-05-02: qty 56

## 2020-05-02 MED ORDER — PHENYLEPHRINE 40 MCG/ML (10ML) SYRINGE FOR IV PUSH (FOR BLOOD PRESSURE SUPPORT): 80.0000 ug | PREFILLED_SYRINGE | INTRAVENOUS | Status: DC | PRN

## 2020-05-02 MED ORDER — CITALOPRAM HYDROBROMIDE 20 MG PO TABS
20.0000 mg | ORAL_TABLET | Freq: Every day | ORAL | Status: DC
  Administered 2020-05-02: 20 mg via ORAL
  Filled 2020-05-02 (×2): qty 1

## 2020-05-02 MED ORDER — LIDOCAINE HCL (PF) 1 % IJ SOLN
INTRAMUSCULAR | Status: DC | PRN
  Administered 2020-05-02: 1 mL

## 2020-05-02 MED ORDER — PRENATAL MULTIVITAMIN CH
1.0000 | ORAL_TABLET | Freq: Every day | ORAL | Status: DC
  Administered 2020-05-03: 1 via ORAL
  Filled 2020-05-02: qty 1

## 2020-05-02 MED ORDER — EPHEDRINE 5 MG/ML INJ
10.0000 mg | INTRAVENOUS | Status: DC | PRN
  Administered 2020-05-02: 10 mg via INTRAVENOUS

## 2020-05-02 MED ORDER — COCONUT OIL OIL: 1.0000 "application " | TOPICAL_OIL | Status: DC | PRN

## 2020-05-02 MED ORDER — LACTATED RINGERS IV SOLN
INTRAVENOUS | Status: DC
  Administered 2020-05-02: 1000 mL via INTRAVENOUS

## 2020-05-02 MED ORDER — DIPHENHYDRAMINE HCL 50 MG/ML IJ SOLN: 12.5000 mg | INTRAMUSCULAR | Status: DC | PRN

## 2020-05-02 MED ORDER — OXYTOCIN BOLUS FROM INFUSION
333.0000 mL | Freq: Once | INTRAVENOUS | Status: AC
  Administered 2020-05-02: 333 mL via INTRAVENOUS

## 2020-05-02 MED ORDER — IBUPROFEN 600 MG PO TABS
600.0000 mg | ORAL_TABLET | Freq: Four times a day (QID) | ORAL | Status: DC
  Administered 2020-05-02 – 2020-05-03 (×3): 600 mg via ORAL
  Filled 2020-05-02 (×3): qty 1

## 2020-05-02 MED ORDER — FENTANYL 2.5 MCG/ML W/ROPIVACAINE 0.15% IN NS 100 ML EPIDURAL (ARMC): 12.0000 mL/h | EPIDURAL | Status: DC

## 2020-05-02 MED ORDER — SODIUM CHLORIDE 0.9 % IV SOLN
INTRAVENOUS | Status: DC | PRN
  Administered 2020-05-02 (×3): 5 mL via EPIDURAL

## 2020-05-02 MED ORDER — OXYTOCIN-SODIUM CHLORIDE 30-0.9 UT/500ML-% IV SOLN
2.5000 [IU]/h | INTRAVENOUS | Status: DC
  Administered 2020-05-02: 2.5 [IU]/h via INTRAVENOUS
  Filled 2020-05-02: qty 500

## 2020-05-02 MED ORDER — CALCIUM CARBONATE ANTACID 500 MG PO CHEW: 400.0000 mg | CHEWABLE_TABLET | Freq: Three times a day (TID) | ORAL | Status: DC | PRN

## 2020-05-02 MED ORDER — MISOPROSTOL 25 MCG QUARTER TABLET: 25.0000 ug | ORAL_TABLET | ORAL | Status: DC | PRN

## 2020-05-02 MED ORDER — ONDANSETRON HCL 4 MG/2ML IJ SOLN: 4.0000 mg | INTRAMUSCULAR | Status: DC | PRN

## 2020-05-02 MED ORDER — ONDANSETRON HCL 4 MG PO TABS: 4.0000 mg | ORAL_TABLET | ORAL | Status: DC | PRN

## 2020-05-02 MED ORDER — LIDOCAINE HCL (PF) 1 % IJ SOLN
INTRAMUSCULAR | Status: AC
  Filled 2020-05-02: qty 30

## 2020-05-02 MED ORDER — FENTANYL 2.5 MCG/ML W/ROPIVACAINE 0.15% IN NS 100 ML EPIDURAL (ARMC)
EPIDURAL | Status: AC
  Filled 2020-05-02: qty 100

## 2020-05-02 MED ORDER — ACETAMINOPHEN 325 MG PO TABS: 650.0000 mg | ORAL_TABLET | ORAL | Status: DC | PRN

## 2020-05-02 MED ORDER — CITALOPRAM HYDROBROMIDE 20 MG PO TABS
20.0000 mg | ORAL_TABLET | Freq: Every day | ORAL | Status: DC
  Filled 2020-05-02: qty 1

## 2020-05-02 MED ORDER — WITCH HAZEL-GLYCERIN EX PADS: 1.0000 "application " | MEDICATED_PAD | CUTANEOUS | Status: DC

## 2020-05-02 MED ORDER — LACTATED RINGERS IV SOLN
500.0000 mL | INTRAVENOUS | Status: DC | PRN
  Administered 2020-05-02: 500 mL via INTRAVENOUS

## 2020-05-02 MED ORDER — AMMONIA AROMATIC IN INHA
RESPIRATORY_TRACT | Status: AC
  Filled 2020-05-02: qty 10

## 2020-05-02 MED ORDER — ACETAMINOPHEN 500 MG PO TABS: 1000.0000 mg | ORAL_TABLET | Freq: Four times a day (QID) | ORAL | Status: DC | PRN

## 2020-05-02 MED ORDER — LIDOCAINE HCL (PF) 1 % IJ SOLN: 30.0000 mL | INTRAMUSCULAR | Status: DC | PRN

## 2020-05-02 MED ORDER — LORATADINE 10 MG PO TABS
10.0000 mg | ORAL_TABLET | Freq: Every day | ORAL | Status: DC
  Filled 2020-05-02: qty 1

## 2020-05-02 MED ORDER — DIBUCAINE (PERIANAL) 1 % EX OINT: 1.0000 "application " | TOPICAL_OINTMENT | CUTANEOUS | Status: DC | PRN

## 2020-05-02 NOTE — OB Triage Note (Signed)
Here for labor check. Contractions started around 0915 this morning and are now every 4 minutes. States bloody discharge.

## 2020-05-02 NOTE — Progress Notes (Signed)
Labor Progress Note  Kari Moore is a 34 y.o. G2P1001 at [redacted]w[redacted]d by ultrasound admitted for active labor  Subjective: comfortable with epidural  Objective: BP (!) 95/57   Pulse 65   Temp 98.5 F (36.9 C) (Oral)   Resp 18   Ht 5\' 1"  (1.549 m)   Wt 98 kg   LMP 07/17/2019   BMI 40.81 kg/m  Notable VS details: reviewed - post-epidural hypotension noted   Fetal Assessment: FHT:  FHR: 120 bpm, variability: moderate,  accelerations:  Present,  decelerations:  Present early decelerations, prolong decel after hypotenstion r/t epidural, resolved with ephedrine Category/reactivity:  Category II UC:   regular, every 3-5 minutes, moderate to palpation SVE:   6/90/0 Membrane status: AROM at 1344 Amniotic color: clear   Labs: Lab Results  Component Value Date   WBC 12.0 (H) 05/02/2020   HGB 13.2 05/02/2020   HCT 39.7 05/02/2020   MCV 82.7 05/02/2020   PLT 200 05/02/2020    Assessment / Plan: Spontaneous labor, progressing normally  Labor: Progressing normally Preeclampsia:  No s/s Fetal Wellbeing:  Category II - Prolong decel resolved after ephedrine was given for post-epidural hypotension.  Fetal tracing reassuring with moderate variability and accels.   Pain Control:  Epidural I/D:  n/a   07/03/2020, CNM 05/02/2020, 2:19 PM

## 2020-05-02 NOTE — Discharge Summary (Signed)
Obstetrical Discharge Summary  Patient Name: Kari Moore DOB: 11-Feb-1986 MRN: [id number]  Date of Admission: 05/02/2020 Date of Delivery: 05/02/2020 Delivered by: Margaretmary Eddy, CNM  Date of Discharge: 05/03/2020  Primary OB: [facility]   PXT:GGYIRSW'N last menstrual period was 07/17/2019. EDC Estimated Date of Delivery: 04/29/20 Gestational Age at Delivery: [redacted]w[redacted]d   Antepartum complications:  1. Hypothyroidism 2. BMI > 40 3. S<D at [redacted]w[redacted]d - 03/31/2020 EFW 2630g, 5lbs 13oz, AFI 18.4cm  Admitting Diagnosis:  Secondary Diagnosis: Patient Active Problem List   Diagnosis Date Noted  . Uterine contractions during pregnancy 05/02/2020  . Encounter for supervision of normal pregnancy in multigravida 10/13/2019  . Scoliosis of lumbosacral spine 10/21/2018  . Vitamin B 12 deficiency 05/28/2017  . Nontraumatic rupture of quadriceps tendon 02/04/2009    Augmentation: AROM Complications: None Intrapartum complications/course: Kari Moore presented to L&D with regular, painful contractions that had become more intense throughout the morning.  She was expectantly managed and progressed to C/C/+2 with spontaneous urge to push.  Delivery Type: spontaneous vaginal delivery Anesthesia: epidural Placenta: spontaneous Laceration: 1st vaginal, not repaired  Episiotomy: none Newborn Data: Live born female  Birth Weight: 6 lb 5.6 oz (2880 g) APGAR: 8, 9  Newborn Delivery   Birth date/time: 05/02/2020 15:25:00 Delivery type: Vaginal, Spontaneous      Postpartum Procedures: None  Edinburgh:  Edinburgh Postnatal Depression Scale Screening Tool 05/03/2020 06/10/2018  I have been able to laugh and see the funny side of things. 0 0  I have looked forward with enjoyment to things. 0 0  I have blamed myself unnecessarily when things went wrong. 0 0  I have been anxious or worried for no good reason. 0 0  I have felt scared or panicky for no good reason. 0 0  Things have been getting on top of me. 0  0  I have been so unhappy that I have had difficulty sleeping. 0 0  I have felt sad or miserable. 0 0  I have been so unhappy that I have been crying. 0 0  The thought of harming myself has occurred to me. 0 0  Edinburgh Postnatal Depression Scale Total 0 0     Post partum course:  Patient had an uncomplicated postpartum course.  By time of discharge on PPD#1, her pain was controlled on oral pain medications; she had appropriate lochia and was ambulating, voiding without difficulty and tolerating regular diet.  She was deemed stable for discharge to home.    Discharge Physical Exam:  BP 103/70 (BP Location: Left Arm)   Pulse 74   Temp 98.2 F (36.8 C) (Oral)   Resp 18   Ht 5\' 1"  (1.549 m)   Wt 98 kg   LMP 07/17/2019   SpO2 98%   BMI 40.81 kg/m   General: NAD CV: RRR Pulm: CTABL, nl effort ABD: s/nd/nt, fundus firm and below the umbilicus Lochia: moderate DVT Evaluation: LE non-ttp, no evidence of DVT on exam.  Hemoglobin  Date Value Ref Range Status  05/03/2020 11.2 (L) 12.0 - 15.0 g/dL Final   HGB  Date Value Ref Range Status  08/19/2014 15.0 12.0 - 16.0 g/dL Final   HCT  Date Value Ref Range Status  05/03/2020 31.8 (L) 36 - 46 % Final  08/19/2014 45.6 35.0 - 47.0 % Final     Disposition: stable, discharge to home. Baby Feeding: breastmilk Baby Disposition: home with mom  Rh Immune globulin given: Rh pos Rubella vaccine given: Immune  Varivax  vaccine given: Immune  Flu vaccine given in AP or PP setting: given 06/2020  Tdap vaccine given in AP or PP setting: given 01/29/2020  Contraception: Vasectomy   Prenatal Labs:  Blood type/Rh A pos   Antibody screen neg  Rubella Immune  Varicella Immune  RPR NR  HBsAg Neg  HIV NR  GC neg  Chlamydia neg  Genetic screening AFP negative  1 hour GTT 133  3 hour GTT N/A  GBS Neg    Plan:  Kari Moore was discharged to home in good condition. Follow-up appointment with delivering provider in 6  weeks.  Discharge Medications: Allergies as of 05/03/2020      Reactions   Compazine [prochlorperazine Edisylate] Other (See Comments)   Dystonia   Morphine And Related Hives   Reglan [metoclopramide] Other (See Comments)   Dystonia   Voltaren [diclofenac Sodium] Nausea Only      Medication List    STOP taking these medications   ondansetron 4 MG disintegrating tablet Commonly known as: ZOFRAN-ODT     TAKE these medications   acetaminophen 325 MG tablet Commonly known as: Tylenol Take 2 tablets (650 mg total) by mouth every 4 (four) hours as needed (for pain scale < 4).   Azelaic Acid 15 % cream Apply 1 application topically daily.   calcium carbonate 500 MG chewable tablet Commonly known as: TUMS - dosed in mg elemental calcium Chew 1 tablet by mouth as needed for indigestion or heartburn.   cetirizine 10 MG tablet Commonly known as: ZYRTEC Take 10 mg by mouth daily.   citalopram 20 MG tablet Commonly known as: CELEXA Take 20 mg by mouth daily.   cyanocobalamin 1000 MCG/ML injection Commonly known as: (VITAMIN B-12) Inject 1,000 mcg into the muscle every 14 (fourteen) days.   fluticasone 50 MCG/ACT nasal spray Commonly known as: FLONASE Place 2 sprays into both nostrils daily as needed.   gi cocktail Susp suspension Take 30 mLs by mouth 4 (four) times daily as needed for indigestion. Shake well.   ibuprofen 600 MG tablet Commonly known as: ADVIL Take 1 tablet (600 mg total) by mouth every 6 (six) hours as needed for mild pain, moderate pain or cramping.   levothyroxine 88 MCG tablet Commonly known as: SYNTHROID Take 88 mcg by mouth daily before breakfast.   multivitamin-prenatal 27-0.8 MG Tabs tablet Take 1 tablet by mouth daily at 12 noon.        Follow-up Information    Gustavo Lah, CNM. Schedule an appointment as soon as possible for a visit in 6 week(s).   Specialty: Certified Nurse Midwife Why: postpartum visit  Contact information: [address] [place] [address] [phone]               Signed: Genia Del, CNM 05/03/2020 3:46 PM

## 2020-05-02 NOTE — Anesthesia Procedure Notes (Signed)
Epidural Patient location during procedure: OB Start time: 05/02/2020 12:45 PM End time: 05/02/2020 12:56 PM  Staffing Anesthesiologist: Karleen Hampshire, MD Performed: anesthesiologist   Preanesthetic Checklist Completed: patient identified, IV checked, site marked, risks and benefits discussed, surgical consent, monitors and equipment checked, pre-op evaluation and timeout performed  Epidural Patient position: sitting Prep: ChloraPrep Patient monitoring: heart rate, continuous pulse ox and blood pressure Approach: midline Location: L4-L5 Injection technique: LOR saline  Needle:  Needle type: Tuohy  Needle gauge: 18 G Needle length: 9 cm and 9 Needle insertion depth: 8 cm Catheter type: closed end flexible Catheter size: 20 Guage Catheter at skin depth: 13 cm Test dose: negative and Other  Assessment Events: blood not aspirated, injection not painful, no injection resistance, no paresthesia and negative IV test  Additional Notes Risks and benefits of procedure discussed with patient.  Risks including but not limited to infection, spinal/epidural hematoma, nerve injury, post dural puncture headache, and inadequate/failed block.  Patient expressed understanding and consented to epidural placement. Negative dural puncture.  Negative aspiration.  Negative paresthesia on injection.  Dose given in divided aliquots.  Patient tolerated the procedure well with no immediate complications.   Reason for block:procedure for pain

## 2020-05-02 NOTE — Anesthesia Preprocedure Evaluation (Signed)
Anesthesia Evaluation  Patient identified by MRN, date of birth, ID band Patient awake    Reviewed: Allergy & Precautions, H&P , NPO status , Patient's Chart, lab work & pertinent test results  Airway Mallampati: III  TM Distance: >3 FB Neck ROM: full    Dental  (+) Teeth Intact   Pulmonary neg pulmonary ROS,           Cardiovascular Exercise Tolerance: Good (-) hypertensionnegative cardio ROS       Neuro/Psych scoliosis    GI/Hepatic negative GI ROS,   Endo/Other  Hypothyroidism   Renal/GU   negative genitourinary   Musculoskeletal   Abdominal   Peds  Hematology negative hematology ROS (+)   Anesthesia Other Findings Obesity  Past Medical History: No date: Allergy No date: Gestational diabetes No date: Hypothyroidism No date: IBS (irritable bowel syndrome) No date: Vitamin B 12 deficiency  Past Surgical History: No date: CHOLECYSTECTOMY No date: CHOLECYSTECTOMY No date: COLONOSCOPY 08/17/2016: COLONOSCOPY WITH PROPOFOL; N/A     Comment:  Procedure: COLONOSCOPY WITH PROPOFOL;  Surgeon: Scot Jun, MD;  Location: [facility] ENDOSCOPY;  Service:               Endoscopy;  Laterality: N/A; 08/17/2016: ESOPHAGOGASTRODUODENOSCOPY (EGD) WITH PROPOFOL; N/A     Comment:  Procedure: ESOPHAGOGASTRODUODENOSCOPY (EGD) WITH               PROPOFOL;  Surgeon: Scot Jun, MD;  Location: [facility]              ENDOSCOPY;  Service: Endoscopy;  Laterality: N/A; No date: WISDOM TOOTH EXTRACTION  BMI    Body Mass Index: 40.81 kg/m      Reproductive/Obstetrics (+) Pregnancy                             Anesthesia Physical Anesthesia Plan  ASA: III  Anesthesia Plan: Epidural   Post-op Pain Management:    Induction:   PONV Risk Score and Plan:   Airway Management Planned:   Additional Equipment:   Intra-op Plan:   Post-operative Plan:   Informed Consent: I have  reviewed the patients History and Physical, chart, labs and discussed the procedure including the risks, benefits and alternatives for the proposed anesthesia with the patient or authorized representative who has indicated his/her understanding and acceptance.       Plan Discussed with: Anesthesiologist  Anesthesia Plan Comments:         Anesthesia Quick Evaluation

## 2020-05-02 NOTE — H&P (Signed)
OB History & Physical   History of Present Illness:  Chief Complaint:   HPI:  Kari Moore is a 34 y.o. G3P1001 female at [redacted]w[redacted]d dated by early [place] at [redacted]w[redacted]d, inconsistent with LMP of 07/17/2019.  She presents to L&D for painful contractions.   Active FM Contractions every 3-5 minutes since 0945 Denies LOF or vaginal bleeding   Pregnancy Issues: 1. Hypothyroidism 2. BMI > 40 3. S<D at [redacted]w[redacted]d - 03/31/2020 EFW 2630g, 5lbs 13oz, AFI 18.4cm  Patient Active Problem List   Diagnosis Date Noted  . Uterine contractions during pregnancy 05/02/2020  . Encounter for supervision of normal pregnancy in multigravida 10/13/2019  . Scoliosis of lumbosacral spine 10/21/2018  . Vitamin B 12 deficiency 05/28/2017  . Nontraumatic rupture of quadriceps tendon 02/04/2009   Maternal Medical History:   Past Medical History:  Diagnosis Date  . Allergy   . Gestational diabetes   . Hypothyroidism   . IBS (irritable bowel syndrome)   . Vitamin B 12 deficiency     Past Surgical History:  Procedure Laterality Date  . CHOLECYSTECTOMY    . CHOLECYSTECTOMY    . COLONOSCOPY    . COLONOSCOPY WITH PROPOFOL N/A 08/17/2016   Procedure: COLONOSCOPY WITH PROPOFOL;  Surgeon: Scot Jun, MD;  Location: [facility] ENDOSCOPY;  Service: Endoscopy;  Laterality: N/A;  . ESOPHAGOGASTRODUODENOSCOPY (EGD) WITH PROPOFOL N/A 08/17/2016   Procedure: ESOPHAGOGASTRODUODENOSCOPY (EGD) WITH PROPOFOL;  Surgeon: Scot Jun, MD;  Location: [facility] ENDOSCOPY;  Service: Endoscopy;  Laterality: N/A;  . WISDOM TOOTH EXTRACTION      Allergies  Allergen Reactions  . Compazine [Prochlorperazine Edisylate] Other (See Comments)    Dystonia  . Morphine And Related Hives  . Reglan [Metoclopramide] Other (See Comments)    Dystonia  . Voltaren [Diclofenac Sodium] Nausea Only    Prior to Admission medications   Medication Sig Start Date End Date Taking? Authorizing Provider  acetaminophen (TYLENOL) 325 MG tablet Take 2 tablets (650  mg total) by mouth every 4 (four) hours as needed (for pain scale < 4). 06/11/18  Yes McVey, Lurena Joiner A, CNM  calcium carbonate (TUMS - DOSED IN MG ELEMENTAL CALCIUM) 500 MG chewable tablet Chew 1 tablet by mouth as needed for indigestion or heartburn.   Yes [provider]  cetirizine (ZYRTEC) 10 MG tablet Take 10 mg by mouth daily.   Yes [provider]  citalopram (CELEXA) 20 MG tablet Take 20 mg by mouth daily.   Yes [provider]  cyanocobalamin (,VITAMIN B-12,) 1000 MCG/ML injection Inject 1,000 mcg into the muscle every 14 (fourteen) days. 10/05/17  Yes [provider]  levothyroxine (SYNTHROID, LEVOTHROID) 88 MCG tablet Take 88 mcg by mouth daily before breakfast.  04/22/18  Yes [provider]  Prenatal Vit-Fe Fumarate-FA (MULTIVITAMIN-PRENATAL) 27-0.8 MG TABS tablet Take 1 tablet by mouth daily at 12 noon.   Yes [provider]  Alum & Mag Hydroxide-Simeth (GI COCKTAIL) SUSP suspension Take 30 mLs by mouth 4 (four) times daily as needed for indigestion. Shake well.    [provider]  ferrous sulfate 325 (65 FE) MG tablet Take 1 tablet (325 mg total) by mouth 2 (two) times daily with a meal. For anemia, take with Vitamin C 06/11/18 08/10/18  McVey, Prudencio Pair, CNM  fluticasone (FLONASE) 50 MCG/ACT nasal spray Place 2 sprays into both nostrils daily as needed.  Patient not taking: Reported on 05/02/2020    [provider]  ibuprofen (ADVIL,MOTRIN) 600 MG tablet Take 1 tablet (  600 mg total) by mouth every 6 (six) hours. Patient not taking: Reported on 05/02/2020 06/11/18   McVey, Lurena Joiner A, CNM  ondansetron (ZOFRAN) 8 MG tablet Take 8 mg by mouth every 8 (eight) hours as needed.  Patient not taking: Reported on 05/02/2020 04/02/18   [provider]     Prenatal care site:  [facility]   Social History: She  reports that she has never smoked. She has never used smokeless tobacco. She reports that she does  not drink alcohol and does not use drugs.  Family History: family history includes Hypertension in her mother. Denies history of gyn cancers.   Review of Systems: A full review of systems was performed and negative except as noted in the HPI.     Physical Exam:  Vital Signs: BP 119/80 (BP Location: Right Arm)   Pulse 67   Temp 98.5 F (36.9 C) (Oral)   Resp 18   Ht 5\' 1"  (1.549 m)   Wt 98 kg   LMP 07/17/2019   BMI 40.81 kg/m   General: no acute distress.  HEENT: normocephalic, atraumatic Heart: regular rate & rhythm.  No murmurs/rubs/gallops Lungs: clear to auscultation bilaterally, normal respiratory effort Abdomen: soft, gravid, non-tender;  EFW: 7 1/2 lbs  Pelvic:   External: Normal external female genitalia  Cervix: Dilation: 5 / Effacement (%): 80 / Station: -2    Extremities: non-tender, symmetric, no edema bilaterally.  DTRs: 2+/2+  Neurologic: Alert & oriented x 3.    Results for orders placed or performed during the hospital encounter of 05/02/20 (from the past 24 hour(s))  CBC     Status: Abnormal   Collection Time: 05/02/20 12:12 PM  Result Value Ref Range   WBC 12.0 (H) 4.0 - 10.5 K/uL   RBC 4.80 3.87 - 5.11 MIL/uL   Hemoglobin 13.2 12.0 - 15.0 g/dL   HCT 07/03/20 36 - 46 %   MCV 82.7 80.0 - 100.0 fL   MCH 27.5 26.0 - 34.0 pg   MCHC 33.2 30.0 - 36.0 g/dL   RDW 17.4 94.4 - 96.7 %   Platelets 200 150 - 400 K/uL   nRBC 0.0 0.0 - 0.2 %    Pertinent Results:  Prenatal Labs: Blood type/Rh A pos  Antibody screen neg  Rubella Immune  Varicella Immune  RPR NR  HBsAg Neg  HIV NR  GC neg  Chlamydia neg  Genetic screening AFP negative   1 hour GTT 133  3 hour GTT N/A  GBS Neg    Tdap: given 01/29/2020 Flu: given 06/2020  FHT: 135 bpm, moderate variability, accels present, decels absent  TOCO: contractions every 3-5 min, moderate to palpation  SVE:  Dilation: 5 / Effacement (%): 80 / Station: -2    Cephalic by leopolds and SVE   Assessment:  Kari Moore is a 34 y.o. G25P1001 female at [redacted]w[redacted]d with active labor .   Plan:  1. Admit to Labor & Delivery; consents reviewed and obtained -Covid screen pending  -Dr. [redacted]w[redacted]d notified of admission   2. Fetal Well being  - Fetal Tracing: cat 1 - Group B Streptococcus ppx indicated: N/A, GBS neg - Presentation: cephalic confirmed by Leopold's and SVE   3. Routine OB: - Prenatal labs reviewed, as above - Rh pos - CBC, T&S, RPR on admit - Clear fluids, IVF  4. Monitoring of Labor -  External toco in place -  Pelvis proven to 3110 grams -  Plan for expectant  management, consider AROM after epidural placement  -  Plan for continuous fetal monitoring  -  Maternal pain control as desired; requesting  regional anesthesia - Anticipate vaginal delivery  5. Post Partum Planning: - Infant feeding: breast - Contraception: Vasectomy   Gustavo Lah, CNM 05/02/20 12:45 PM  Margaretmary Eddy, CNM Certified Nurse Midwife [place]  [facility]

## 2020-05-03 LAB — RPR: RPR Ser Ql: NONREACTIVE

## 2020-05-03 LAB — CBC
HCT: 31.8 % — ABNORMAL LOW (ref 36.0–46.0)
Hemoglobin: 11.2 g/dL — ABNORMAL LOW (ref 12.0–15.0)
MCH: 28.7 pg (ref 26.0–34.0)
MCHC: 35.2 g/dL (ref 30.0–36.0)
MCV: 81.5 fL (ref 80.0–100.0)
Platelets: 149 10*3/uL — ABNORMAL LOW (ref 150–400)
RBC: 3.9 MIL/uL (ref 3.87–5.11)
RDW: 14.4 % (ref 11.5–15.5)
WBC: 12.7 10*3/uL — ABNORMAL HIGH (ref 4.0–10.5)
nRBC: 0 % (ref 0.0–0.2)

## 2020-05-03 MED ORDER — IBUPROFEN 600 MG PO TABS: 600.0000 mg | ORAL_TABLET | Freq: Four times a day (QID) | ORAL | 0 refills | Status: DC | PRN

## 2020-05-03 MED ORDER — SENNOSIDES-DOCUSATE SODIUM 8.6-50 MG PO TABS
1.0000 | ORAL_TABLET | Freq: Once | ORAL | Status: AC
  Administered 2020-05-03: 1 via ORAL
  Filled 2020-05-03: qty 1

## 2020-05-03 NOTE — Discharge Instructions (Signed)

## 2020-05-03 NOTE — Anesthesia Postprocedure Evaluation (Signed)
Anesthesia Post Note  Patient: Kari Moore  Procedure(s) Performed: AN AD HOC LABOR EPIDURAL  Patient location during evaluation: Mother Baby Anesthesia Type: Epidural Level of consciousness: awake and alert Pain management: pain level controlled Vital Signs Assessment: post-procedure vital signs reviewed and stable Respiratory status: spontaneous breathing, nonlabored ventilation and respiratory function stable Cardiovascular status: stable Postop Assessment: no headache, no backache and epidural receding Anesthetic complications: no   No complications documented.   Last Vitals:  Vitals:   05/03/20 0430 05/03/20 0830  BP: 112/75 100/69  Pulse: 69 62  Resp: 18 20  Temp: 36.5 C 36.9 C  SpO2: 100% 98%    Last Pain:  Vitals:   05/03/20 0830  TempSrc: Oral  PainSc:                  [name]

## 2020-05-03 NOTE — Progress Notes (Signed)
Post Partum Day 1  Subjective: Doing well, no concerns. Ambulating without difficulty, pain managed with PO meds, tolerating regular diet, and voiding without difficulty.   No fever/chills, chest pain, shortness of breath, nausea/vomiting, or leg pain. No nipple or breast pain.   Objective: BP 112/75   Pulse 69   Temp 97.7 F (36.5 C) (Oral)   Resp 18   Ht 5\' 1"  (1.549 m)   Wt 98 kg   LMP 07/17/2019   SpO2 100%   BMI 40.81 kg/m    Physical Exam:  General: alert, cooperative, appears stated age and fatigued Breasts: soft/nontender CV: RRR Pulm: nl effort, CTABL Abdomen: soft, non-tender, active bowel sounds Uterine Fundus: firm Lochia: appropriate DVT Evaluation: No evidence of DVT seen on physical exam. No cords or calf tenderness. No significant calf/ankle edema.  Recent Labs    05/02/20 1212  HGB 13.2  HCT 39.7  WBC 12.0*  PLT 200    Assessment/Plan: 34 y.o. G2P1001 postpartum day # 1  -Continue routine postpartum care -Lactation consult PRN for breastfeeding  -Plans partner vasectomy for contraception -AM CBC pending  -Immunization status:  all immunizations up to date  Disposition: Desires discharge home today if baby cleared for discharge   LOS: 1 day   38, CNM 05/03/2020, 7:47 AM   ----- 07/04/2020 Certified Nurse Midwife [facility]

## 2020-05-03 NOTE — Progress Notes (Signed)
Discharge instructions, prescriptions, education, and appointments given and explained. Pt verbalized understanding with no further questions, pt wheeled to personal vehicle via staff

## 2021-11-02 ENCOUNTER — Telehealth: Payer: BC Managed Care – PPO | Admitting: Nurse Practitioner

## 2021-11-02 DIAGNOSIS — J111 Influenza due to unidentified influenza virus with other respiratory manifestations: Secondary | ICD-10-CM

## 2021-11-02 MED ORDER — OSELTAMIVIR PHOSPHATE 75 MG PO CAPS: 75.0000 mg | ORAL_CAPSULE | Freq: Two times a day (BID) | ORAL | 0 refills | Status: DC

## 2021-11-02 NOTE — Progress Notes (Signed)

## 2022-02-07 ENCOUNTER — Telehealth: Payer: BC Managed Care – PPO | Admitting: Emergency Medicine

## 2022-02-07 DIAGNOSIS — B309 Viral conjunctivitis, unspecified: Secondary | ICD-10-CM

## 2022-02-07 NOTE — Progress Notes (Signed)
E-Visit for Pink Eye ? ? ?We are sorry that you are not feeling well.  Here is how we plan to help! ? ?Based on what you have shared with me it looks like you have conjunctivitis.  Conjunctivitis is a common inflammatory or infectious condition of the eye that is often referred to as "pink eye".  In most cases it is contagious (viral or bacterial). However, not all conjunctivitis requires antibiotics (ex. Allergic).  Based on your symptoms, I suspect you have viral conjunctivitis  ? ?I recommend that you use OpconA, 1-2 drops every 4-6 hours (an over the counter allergy drop available at your local pharmacy).  Your pharmacist may have an alternative suggestion. ? ?Pink eye can be highly contagious.  It is typically spread through direct contact with secretions, or contaminated objects or surfaces that one may have touched.  Strict handwashing is suggested with soap and water is urged.  If not available, use alcohol based had sanitizer.  Avoid unnecessary touching of the eye.  If you wear contact lenses, you will need to refrain from wearing them until you see no white discharge from the eye for at least 24 hours after being on medication.  You should see symptom improvement in 1-2 days after starting the medication regimen.  Call us if symptoms are not improved in 1-2 days. ? ?It is ok to work with pink eye as long as you are very careful about washing your hands if you touch your eyes at all. Let me know if you need a note to return to work.  ? ? ?Home Care: ?Wash your hands often! ?Do not wear your contacts until you complete your treatment plan. ?Avoid sharing towels, bed linen, personal items with a person who has pink eye. ?See attention for anyone in your home with similar symptoms. ? ?Get Help Right Away If: ?Your symptoms do not improve. ?You develop blurred or loss of vision. ?Your symptoms worsen (increased discharge, pain or redness) ? ? ?Thank you for choosing an e-visit. ? ?Your e-visit answers were  reviewed by a board certified advanced clinical practitioner to complete your personal care plan. Depending upon the condition, your plan could have included both over the counter or prescription medications. ? ?Please review your pharmacy choice. Make sure the pharmacy is open so you can pick up prescription now. If there is a problem, you may contact your provider through [employer] and have the prescription routed to another pharmacy.  Your safety is important to [place]. If you have drug allergies check your prescription carefully.  ? ?For the next 24 hours you can use MyChart to ask questions about today's visit, request a non-urgent call back, or ask for a work or school excuse. ?You will get an email in the next two days asking about your experience. I hope that your e-visit has been valuable and will speed your recovery. ? ?I have spent 5 minutes in review of e-visit questionnaire, review and updating patient chart, medical decision making and response to patient.  ? ?[name], PhD, FNP-BC ?  ?

## 2023-07-24 ENCOUNTER — Other Ambulatory Visit: Payer: Self-pay

## 2023-07-24 MED ORDER — CITALOPRAM HYDROBROMIDE 40 MG PO TABS
40.0000 mg | ORAL_TABLET | Freq: Every day | ORAL | 1 refills | Status: AC
  Filled 2023-07-24: qty 90, 90d supply, fill #0

## 2023-07-24 MED ORDER — LEVOTHYROXINE SODIUM 100 MCG PO TABS
100.0000 ug | ORAL_TABLET | Freq: Every morning | ORAL | 1 refills | Status: AC
  Filled 2023-07-31: qty 90, 90d supply, fill #0

## 2023-07-24 MED ORDER — NORGESTIMATE-ETH ESTRADIOL 0.25-35 MG-MCG PO TABS
1.0000 | ORAL_TABLET | Freq: Every day | ORAL | 1 refills | Status: DC
  Filled 2023-08-02: qty 56, 56d supply, fill #0

## 2023-07-24 MED ORDER — CYANOCOBALAMIN 1000 MCG/ML IJ SOLN
1000.0000 ug | INTRAMUSCULAR | 4 refills | Status: AC
  Filled 2023-07-24 – 2023-09-20 (×2): qty 3, 90d supply, fill #0

## 2023-07-31 ENCOUNTER — Other Ambulatory Visit: Payer: Self-pay

## 2023-08-02 ENCOUNTER — Other Ambulatory Visit: Payer: Self-pay

## 2023-08-29 ENCOUNTER — Emergency Department (HOSPITAL_BASED_OUTPATIENT_CLINIC_OR_DEPARTMENT_OTHER)
Admission: EM | Admit: 2023-08-29 | Discharge: 2023-08-29 | Disposition: A | Discharge status: Home / Self Care | Payer: BC Managed Care – PPO | Attending: Emergency Medicine | Admitting: Emergency Medicine

## 2023-08-29 ENCOUNTER — Encounter (HOSPITAL_BASED_OUTPATIENT_CLINIC_OR_DEPARTMENT_OTHER): Payer: Self-pay | Admitting: Emergency Medicine

## 2023-08-29 ENCOUNTER — Other Ambulatory Visit: Payer: Self-pay

## 2023-08-29 DIAGNOSIS — K2900 Acute gastritis without bleeding: Secondary | ICD-10-CM | POA: Insufficient documentation

## 2023-08-29 DIAGNOSIS — Z79899 Other long term (current) drug therapy: Secondary | ICD-10-CM | POA: Insufficient documentation

## 2023-08-29 DIAGNOSIS — E039 Hypothyroidism, unspecified: Secondary | ICD-10-CM | POA: Insufficient documentation

## 2023-08-29 DIAGNOSIS — R1012 Left upper quadrant pain: Secondary | ICD-10-CM | POA: Diagnosis present

## 2023-08-29 MED ORDER — PANTOPRAZOLE SODIUM 40 MG PO TBEC
40.0000 mg | DELAYED_RELEASE_TABLET | Freq: Every day | ORAL | 0 refills | Status: DC
  Filled 2023-08-29: qty 30, 30d supply, fill #0

## 2023-08-29 MED ORDER — ALUM & MAG HYDROXIDE-SIMETH 200-200-20 MG/5ML PO SUSP
30.0000 mL | Freq: Once | ORAL | Status: AC
  Administered 2023-08-29: 30 mL via ORAL
  Filled 2023-08-29: qty 30

## 2023-08-29 MED ORDER — LIDOCAINE VISCOUS HCL 2 % MT SOLN
15.0000 mL | Freq: Once | OROMUCOSAL | Status: AC
Start: 2023-08-29 — End: 2023-08-29
  Administered 2023-08-29: 15 mL via ORAL
  Filled 2023-08-29: qty 15

## 2023-08-29 MED ORDER — LIDOCAINE VISCOUS HCL 2 % MT SOLN
15.0000 mL | OROMUCOSAL | 0 refills | Status: AC | PRN
  Filled 2023-08-29: qty 100, 5d supply, fill #0

## 2023-08-29 MED ORDER — SUCRALFATE 1 G PO TABS
1.0000 g | ORAL_TABLET | Freq: Three times a day (TID) | ORAL | 0 refills | Status: DC
  Filled 2023-08-29: qty 56, 14d supply, fill #0

## 2023-08-29 MED ORDER — ONDANSETRON 4 MG PO TBDP
4.0000 mg | ORAL_TABLET | Freq: Three times a day (TID) | ORAL | 0 refills | Status: DC | PRN
  Filled 2023-08-29: qty 18, 6d supply, fill #0

## 2023-08-29 MED ORDER — DICYCLOMINE HCL 10 MG PO CAPS
10.0000 mg | ORAL_CAPSULE | Freq: Once | ORAL | Status: AC
  Administered 2023-08-29: 10 mg via ORAL
  Filled 2023-08-29: qty 1

## 2023-08-29 NOTE — Discharge Instructions (Signed)
You were seen in the emergency room today for abdominal pain.  Labs were not obtained today as I was able to review labs drawn earlier this morning.  Your pain was improved after GI cocktail and Bentyl.  Your symptoms seem consistent with gastritis, and I have sent medications to your pharmacy which should help improve your symptoms.  I would recommend that you follow-up with primary care.  Make sure you are avoiding triggers that worsen your abdominal pain.  Please return to emergency room with any new or worsening symptoms.

## 2023-08-29 NOTE — ED Triage Notes (Addendum)
LUQ abdo pain.  Started Friday  Seen at [facility]   Blood work down (results in epic) but told to go to ed if pain got worse. Some diarrhea at first. Some nausea Eating/ drinking increases pain.

## 2023-08-29 NOTE — ED Provider Notes (Signed)
[place] EMERGENCY DEPARTMENT AT [facility] Provider Note   CSN: [id number] Arrival date & time: 08/29/23  1847     History  Chief Complaint  Patient presents with   Abdominal Pain    Kari Moore is a 37 y.o. female with past medical history of hypothyroidism, irritable bowel syndrome presenting to emergency room with left upper quadrant abdominal pain that has been intermittent since Friday.  Patient reports she was seen at urgent care and recently had blood drawn which was unremarkable.  Patient reports she had diarrhea when this started 6 days ago but now primarily having abdominal discomfort, nausea and episodic vomiting.  Patient reports pain is worse when she eats.  Denies any blood in urine, vomit, stool.  Denies fever, chills, chest pain or shortness of breath.   Abdominal Pain      Home Medications Prior to Admission medications   Medication Sig Start Date End Date Taking? Authorizing Provider  acetaminophen (TYLENOL) 325 MG tablet Take 2 tablets (650 mg total) by mouth every 4 (four) hours as needed (for pain scale < 4). 06/11/18   McVey, Prudencio Pair, CNM  Alum & Mag Hydroxide-Simeth (GI COCKTAIL) SUSP suspension Take 30 mLs by mouth 4 (four) times daily as needed for indigestion. Shake well.    [provider]  Azelaic Acid 15 % cream Apply 1 application topically daily. 12/25/19   [provider]  calcium carbonate (TUMS - DOSED IN MG ELEMENTAL CALCIUM) 500 MG chewable tablet Chew 1 tablet by mouth as needed for indigestion or heartburn.    [provider]  cetirizine (ZYRTEC) 10 MG tablet Take 10 mg by mouth daily.    [provider]  citalopram (CELEXA) 20 MG tablet Take 20 mg by mouth daily.    [provider]  citalopram (CELEXA) 40 MG tablet Take 1 tablet (40 mg total) by mouth daily. 01/01/23     cyanocobalamin (,VITAMIN B-12,) 1000 MCG/ML injection Inject 1,000 mcg into the muscle every 14 (fourteen) days.  10/05/17   [provider]  cyanocobalamin (VITAMIN B12) 1000 MCG/ML injection Inject 1 mL (1,000 mcg total) into the muscle every 30 (thirty) days. 01/01/23     fluticasone (FLONASE) 50 MCG/ACT nasal spray Place 2 sprays into both nostrils daily as needed.  Patient not taking: Reported on 05/02/2020    [provider]  ibuprofen (ADVIL) 600 MG tablet Take 1 tablet (600 mg total) by mouth every 6 (six) hours as needed for mild pain, moderate pain or cramping. 05/03/20   Genia Del, CNM  levothyroxine (SYNTHROID) 100 MCG tablet Take 1 tablet (100 mcg total) by mouth every morning 30-60 minutes before breakfast 01/01/23     levothyroxine (SYNTHROID, LEVOTHROID) 88 MCG tablet Take 88 mcg by mouth daily before breakfast.  04/22/18   [provider]  norgestimate-ethinyl estradiol (ORTHO-CYCLEN) 0.25-35 MG-MCG tablet Take 1 tablet by mouth daily. 07/12/23     oseltamivir (TAMIFLU) 75 MG capsule Take 1 capsule (75 mg total) by mouth 2 (two) times daily. 11/02/21   Bennie Pierini, FNP  Prenatal Vit-Fe Fumarate-FA (MULTIVITAMIN-PRENATAL) 27-0.8 MG TABS tablet Take 1 tablet by mouth daily at 12 noon.    [provider]      Allergies    Compazine [prochlorperazine edisylate], Morphine and codeine, Reglan [metoclopramide], and Voltaren [diclofenac sodium]    Review of Systems   Review of Systems  Gastrointestinal:  Positive for abdominal pain.    Physical Exam Updated Vital Signs BP 134/80 (BP  Location: Right Arm)   Pulse 69   Temp 98.1 F (36.7 C) (Oral)   Resp 18   SpO2 99%  Physical Exam Vitals and nursing note reviewed.  Constitutional:      General: She is not in acute distress.    Appearance: She is not toxic-appearing.  HENT:     Head: Normocephalic and atraumatic.     Nose: No congestion or rhinorrhea.     Mouth/Throat:     Mouth: Mucous membranes are moist.  Eyes:     General: No scleral icterus.    Conjunctiva/sclera: Conjunctivae  normal.  Neck:     Vascular: No carotid bruit.  Cardiovascular:     Rate and Rhythm: Normal rate and regular rhythm.     Pulses: Normal pulses.     Heart sounds: Normal heart sounds.  Pulmonary:     Effort: Pulmonary effort is normal. No respiratory distress.     Breath sounds: Normal breath sounds.  Abdominal:     General: Abdomen is flat. Bowel sounds are normal.     Palpations: Abdomen is soft.     Tenderness: There is abdominal tenderness.     Comments: Mild tenderness over epigastric and left upper quadrant abdominal pain is soft, nondistended without mass.  No CVA tenderness  Musculoskeletal:     Cervical back: No rigidity or tenderness.     Right lower leg: No edema.     Left lower leg: No edema.  Lymphadenopathy:     Cervical: No cervical adenopathy.  Skin:    General: Skin is warm and dry.     Capillary Refill: Capillary refill takes less than 2 seconds.     Findings: No lesion.  Neurological:     General: No focal deficit present.     Mental Status: She is alert and oriented to person, place, and time. Mental status is at baseline.     Cranial Nerves: No cranial nerve deficit.     Sensory: No sensory deficit.     Motor: No weakness.     Coordination: Coordination normal.     Gait: Gait normal.     ED Results / Procedures / Treatments   Labs (all labs ordered are listed, but only abnormal results are displayed) Labs Reviewed - No data to display  EKG None  Radiology No results found.  Procedures Procedures    Medications Ordered in ED Medications  alum & mag hydroxide-simeth (MAALOX/MYLANTA) 200-200-20 MG/5ML suspension 30 mL (30 mLs Oral Given 08/29/23 1953)    And  lidocaine (XYLOCAINE) 2 % viscous mouth solution 15 mL (15 mLs Oral Given 08/29/23 1953)  dicyclomine (BENTYL) capsule 10 mg (10 mg Oral Given 08/29/23 1954)    ED Course/ Medical Decision Making/ A&P                                 Medical Decision Making Risk OTC  drugs. Prescription drug management.   [name] 37 y.o. presented today for abd pain. Working DDx includes, but not limited to, gastroenteritis, colitis, SBO, appendicitis, cholecystitis, hepatobiliary pathology, gastritis, PUD, ACS, dissection, pancreatitis, nephrolithiasis, AAA, UTI, pyelonephritis, ruptured ectopic pregnancy, PID, ovarian  R/o DDx: These are considered less likely than current impression due to history of present illness, physical exam, labs/imaging findings.  Review of prior external notes: Reviewed epic labs from 08/29/2023  Pmhx: hypothyroidism, irritable bowel syndrome  Unique Tests and My Interpretation:  Reviewed from  epic   Imaging:  CT Abd/Pelvis with contrast: evaluate for structural/surgical etiology of patients' severe abdominal pain.   Problem List / ED Course / Critical interventions / Medication management  Patient reporting to emergency room with mild epigastric and left upper quadrant abdominal pain.  Patient is tolerating p.o. describes worsening symptoms when she eats.  Patient had labs drawn at urgent care which I can review through epic.  Patient had CBC, CMP, lipase, UA, urine pregnancy test done today.  Patient reports that she took Maalox at home and had resolution of symptoms, patient requesting something for pain control.  I have prescribed GI cocktail.  After GI cocktail, patient reports resolution of symptoms.  Given symptoms I am concerned about gastric ulcer versus gastritis.  I have sent patient with outpatient PPI as well as Carafate for symptoms.  Patient can always return with any new or worsening symptoms. I ordered medication including GI cocktail  for abdominal pain  Reevaluation of the patient after these medicines showed that the patient resolved Patients vitals assessed. Upon arrival patient is hemodynamically stable.  I have reviewed the patients home medicines and have made adjustments as needed  Consult: None  Plan:  F/u  w/ PCP in 2-3d to ensure resolution of sx.  Patient was given return precautions. Patient stable for discharge at this time.  Patient educated on current sx/dx and verbalized understanding of plan. Return to ER w/ new or worsening sx.          Final Clinical Impression(s) / ED Diagnoses Final diagnoses:  Acute gastritis without hemorrhage, unspecified gastritis type    Rx / DC Orders ED Discharge Orders     None         Reinaldo Raddle 08/30/23 Alexia Freestone, MD 09/03/23 [phone]

## 2023-08-30 ENCOUNTER — Other Ambulatory Visit: Payer: Self-pay

## 2023-09-04 ENCOUNTER — Encounter (INDEPENDENT_AMBULATORY_CARE_PROVIDER_SITE_OTHER): Payer: Self-pay | Admitting: *Deleted

## 2023-09-04 ENCOUNTER — Other Ambulatory Visit: Payer: Self-pay | Admitting: Internal Medicine

## 2023-09-04 ENCOUNTER — Other Ambulatory Visit: Payer: Self-pay

## 2023-09-04 ENCOUNTER — Ambulatory Visit
Admission: RE | Admit: 2023-09-04 | Discharge: 2023-09-04 | Disposition: A | Discharge status: Home / Self Care | Payer: BC Managed Care – PPO | Source: Ambulatory Visit | Attending: Internal Medicine | Admitting: Internal Medicine

## 2023-09-04 DIAGNOSIS — R1084 Generalized abdominal pain: Secondary | ICD-10-CM | POA: Insufficient documentation

## 2023-09-04 MED ORDER — FAMOTIDINE 40 MG PO TABS
40.0000 mg | ORAL_TABLET | Freq: Every day | ORAL | 11 refills | Status: AC
  Filled 2023-09-04: qty 30, 30d supply, fill #0

## 2023-09-04 MED ORDER — HYOSCYAMINE SULFATE 0.125 MG PO TABS
0.1250 mg | ORAL_TABLET | Freq: Two times a day (BID) | ORAL | 0 refills | Status: AC
  Filled 2023-09-04: qty 60, 30d supply, fill #0

## 2023-09-05 ENCOUNTER — Other Ambulatory Visit: Payer: Self-pay

## 2023-09-05 MED ORDER — SUCRALFATE 1 G PO TABS
1.0000 g | ORAL_TABLET | Freq: Three times a day (TID) | ORAL | 5 refills | Status: AC
  Filled 2023-09-05: qty 120, 30d supply, fill #0

## 2023-09-10 ENCOUNTER — Other Ambulatory Visit: Payer: Self-pay

## 2023-09-19 ENCOUNTER — Ambulatory Visit (INDEPENDENT_AMBULATORY_CARE_PROVIDER_SITE_OTHER): Payer: BC Managed Care – PPO | Admitting: Gastroenterology

## 2023-09-19 ENCOUNTER — Encounter (INDEPENDENT_AMBULATORY_CARE_PROVIDER_SITE_OTHER): Payer: Self-pay | Admitting: Gastroenterology

## 2023-09-19 ENCOUNTER — Other Ambulatory Visit: Payer: Self-pay

## 2023-09-19 VITALS — BP 105/73 | HR 69 | Temp 98.0°F | Ht 61.0 in | Wt 214.8 lb

## 2023-09-19 DIAGNOSIS — E538 Deficiency of other specified B group vitamins: Secondary | ICD-10-CM | POA: Diagnosis not present

## 2023-09-19 DIAGNOSIS — K921 Melena: Secondary | ICD-10-CM | POA: Diagnosis not present

## 2023-09-19 DIAGNOSIS — K582 Mixed irritable bowel syndrome: Secondary | ICD-10-CM | POA: Insufficient documentation

## 2023-09-19 DIAGNOSIS — R1012 Left upper quadrant pain: Secondary | ICD-10-CM | POA: Diagnosis not present

## 2023-09-19 MED ORDER — NA SULFATE-K SULFATE-MG SULF 17.5-3.13-1.6 GM/177ML PO SOLN
ORAL | 0 refills | Status: AC
  Filled 2023-09-19: qty 354, 1d supply, fill #0

## 2023-09-19 MED ORDER — PANTOPRAZOLE SODIUM 40 MG PO TBEC
40.0000 mg | DELAYED_RELEASE_TABLET | Freq: Every day | ORAL | 3 refills | Status: DC
  Filled 2023-09-19: qty 31, 31d supply, fill #0
  Filled 2023-09-24: qty 29, 29d supply, fill #0

## 2023-09-19 NOTE — Progress Notes (Signed)
Kari Moore , M.D. Gastroenterology & Hepatology [facility] Gastroenter[facility] [address] [place], [place] [address] Primary Care Physician: Kari Penton, MD [address] [facility] [place] [address]  Chief Complaint:  Left Upper quadrant abdominal pain, hematochezia, vitamin B12 deficiency  History of Present Illness: Kari Moore is a 37 y.o. female with hypothyroidism, migraine, IBS , vitamin B12 deficiency obesity who presents for evaluation of Left Upper quadrant abdominal pain, hematochezia, vitamin B12 deficiency  Patient presented with left upper quadrant abdominal pain for past few weeks.  Patient reports pain started suddenly accompanied by nausea nonradiating exacerbated with food intake and improves with empty stomach.  No relationship with defecation  Patient takes Excedrin on a regular basis due to migraine pain.  Patient does have alternating bowel movement with Bristol stool scale 2-3 but recently has been 7 since onset of this left upper quadrant pain  Recently patient was also noticed fresh blood upon wiping on defecating. Appears patient was seen in 2017 at [facility] for left and right upper abdominal pain where she had underwent upper endoscopy and colonoscopy in 2017 .  Patient was given diagnosis of IBS and was started on SSRI at that time  Last EGD:2017 Last Colonoscopy:2017  FHx: neg for any gastrointestinal/liver disease, no malignancies Social: neg smoking, alcohol or illicit drug use Surgical: Cholecystectomy  Past Medical History: Past Medical History:  Diagnosis Date   Allergy    Gestational diabetes    Hypothyroidism    IBS (irritable bowel syndrome)    Scoliosis    Vitamin B 12 deficiency     Past Surgical History: Past Surgical History:  Procedure Laterality Date   CHOLECYSTECTOMY     CHOLECYSTECTOMY     COLONOSCOPY     COLONOSCOPY WITH PROPOFOL N/A  08/17/2016   Procedure: COLONOSCOPY WITH PROPOFOL;  Surgeon: Scot Jun, MD;  Location: [facility] ENDOSCOPY;  Service: Endoscopy;  Laterality: N/A;   ESOPHAGOGASTRODUODENOSCOPY (EGD) WITH PROPOFOL N/A 08/17/2016   Procedure: ESOPHAGOGASTRODUODENOSCOPY (EGD) WITH PROPOFOL;  Surgeon: Scot Jun, MD;  Location: [facility] ENDOSCOPY;  Service: Endoscopy;  Laterality: N/A;   WISDOM TOOTH EXTRACTION      Family History: Family History  Problem Relation Age of Onset   Hypertension Mother        Preeclampsia     Social History: Social History   Tobacco Use  Smoking Status Never   Passive exposure: Never  Smokeless Tobacco Never   Social History   Substance and Sexual Activity  Alcohol Use No   Social History   Substance and Sexual Activity  Drug Use No    Allergies: Allergies  Allergen Reactions   Compazine [Prochlorperazine Edisylate] Other (See Comments)    Dystonia   Morphine And Codeine Hives   Reglan [Metoclopramide] Other (See Comments)    Dystonia   Voltaren [Diclofenac Sodium] Nausea Only    Medications: Current Outpatient Medications  Medication Sig Dispense Refill   Alum & Mag Hydroxide-Simeth (GI COCKTAIL) SUSP suspension Take 30 mLs by mouth 4 (four) times daily as needed for indigestion. Shake well.     calcium carbonate (TUMS - DOSED IN MG ELEMENTAL CALCIUM) 500 MG chewable tablet Chew 1 tablet by mouth as needed for indigestion or heartburn.     cetirizine (ZYRTEC) 10 MG tablet Take 10 mg by mouth daily.     citalopram (CELEXA) 40 MG tablet Take 1 tablet (40 mg total) by mouth daily. 90 tablet  1   cyanocobalamin (VITAMIN B12) 1000 MCG/ML injection Inject 1 mL (1,000 mcg total) into the muscle every 30 (thirty) days. 10 mL 4   famotidine (PEPCID) 40 MG tablet Take 1 tablet (40 mg total) by mouth at bedtime. 30 tablet 11   fluticasone (FLONASE) 50 MCG/ACT nasal spray Place 2 sprays into both nostrils daily as needed.     hyoscyamine (LEVSIN) 0.125 MG tablet  Take 1 tablet (0.125 mg total) by mouth 2 (two) times daily. 60 tablet 0   levothyroxine (SYNTHROID) 100 MCG tablet Take 1 tablet (100 mcg total) by mouth every morning 30-60 minutes before breakfast 90 tablet 1   Na Sulfate-K Sulfate-Mg Sulf 17.5-3.13-1.6 GM/177ML SOLN Use as directed 354 mL 0   norgestimate-ethinyl estradiol (ORTHO-CYCLEN) 0.25-35 MG-MCG tablet Take 1 tablet by mouth daily. 28 tablet 1   pantoprazole (PROTONIX) 40 MG tablet Take 1 tablet (40 mg total) by mouth daily. 60 tablet 3   sucralfate (CARAFATE) 1 g tablet Take 1 tablet (1 g total) by mouth 4 (four) times daily -  before meals and at bedtime. 120 tablet 5   No current facility-administered medications for this visit.    Review of Systems: GENERAL: negative for malaise, night sweats HEENT: No changes in hearing or vision, no nose bleeds or other nasal problems. NECK: Negative for lumps, goiter, pain and significant neck swelling RESPIRATORY: Negative for cough, wheezing CARDIOVASCULAR: Negative for chest pain, leg swelling, palpitations, orthopnea GI: SEE HPI MUSCULOSKELETAL: Negative for joint pain or swelling, back pain, and muscle pain. SKIN: Negative for lesions, rash HEMATOLOGY Negative for prolonged bleeding, bruising easily, and swollen nodes. ENDOCRINE: Negative for cold or heat intolerance, polyuria, polydipsia and goiter. NEURO: negative for tremor, gait imbalance, syncope and seizures. The remainder of the review of systems is noncontributory.   Physical Exam: BP 105/73 (BP Location: Left Arm, Patient Position: Sitting, Cuff Size: Large)   Pulse 69   Temp 98 F (36.7 C) (Oral)   Ht 5\' 1"  (1.549 m)   Wt 214 lb 12.8 oz (97.4 kg)   BMI 40.59 kg/m  GENERAL: The patient is AO x3, in no acute distress. HEENT: Head is normocephalic and atraumatic. EOMI are intact. Mouth is well hydrated and without lesions. NECK: Supple. No masses LUNGS: Clear to auscultation. No presence of rhonchi/wheezing/rales.  Adequate chest expansion HEART: RRR, normal s1 and s2. ABDOMEN: Soft, nontender, no guarding, no peritoneal signs, and nondistended. BS +. No masses.  Imaging/Labs:  CT abdomen pelvis without contrast done for epigastric pain IMPRESSION: Scattered stool.  Normal appendix.  No obstruction or free air. No obstructing renal stone. Previous cholecystectomy    Labs from 09/04/2023 hemoglobin 14.2 platelet 282 Normal liver enzymes T. bili 0.4 Normal lipase 1.7     Latest Ref Rng & Units 05/03/2020    7:19 AM 05/02/2020   12:12 PM 06/11/2018    8:23 AM  CBC  WBC 4.0 - 10.5 K/uL 12.7  12.0  18.1   Hemoglobin 12.0 - 15.0 g/dL 57.8  46.9  9.8   Hematocrit 36.0 - 46.0 % 31.8  39.7  28.8   Platelets 150 - 400 K/uL 149  200  170    No results found for: "IRON", "TIBC", "FERRITIN"  I personally reviewed and interpreted the available labs, imaging and endoscopic files.  Impression and Plan:  Kari Moore is a 37 y.o. female with hypothyroidism, migraine, IBS , vitamin B12 deficiency obesity who presents for evaluation of Left Upper quadrant abdominal pain,  hematochezia, vitamin B12 deficiency  #Left upper quadrant pain  #IBS  #Vitamin B12 deficiency  Patient has left upper quadrant pain which is worsened postprandial.  Given NSAID use on regular basis such as Excedrin this could be peptic ulcer disease  Will empirically give pantoprazole 40 mg 30 minutes before breakfast daily.  Can stop sucralfate at this time  Patient does have reported history of IBS but currently on my assessment does not have Rome IV criteria for diagnosis of IBS as abdominal pain is not related to change in bowel frequency or defecation  Nevertheless I will check for celiac serology given high incidence of celiac disease and IBS patient  Recent CT scan and lab work within normal limits.  Patient abdominal pain actually appears to be chronic in nature as she was seen by GI in 2017 for abdominal pain underwent  upper endoscopy and colonoscopy and was started on SSRI with adequate response  Also patient is on vitamin B12 supplementation for longstanding deficiency.  Will evaluate for pernicious anemia by sending intrinsic factor antibody and antiparietal antibody  Will proceed with diagnostic upper endoscopy to evaluate for any peptic ulcer disease and will take mapping biopsies of gastric body to evaluate for any metaplasia/dysplasia given history of vitamin B12 deficiency  - Check celiac serology - Start IBGard 1 tablet every 8-12 hours as needed -Pernicious anemia workup   #Hematochezia   Patient reports intermittent fresh blood upon wiping this could be hemorrhoidal bleed but is considered an alarm symptom.  Last colonoscopy 2017 for which I do not have the report.  Nevertheless it has been at least 7 years and onset of new alarm symptom diagnostic colonoscopy is indicated  Orders Placed This Encounter  Procedures   C-reactive protein   Ferritin   Celiac Ab tTG DGP TIgA   IgA   Vitamin B12 Deficiency [place]   Pregnancy, urine    All questions were answered.      Kari Lawman, MD Gastroenterology and Hepatology [facility]enter[facility]   This chart has been completed using [facility] Dictation software, and while attempts have been made to ensure accuracy , certain words and phrases may not be transcribed as intended

## 2023-09-19 NOTE — Patient Instructions (Addendum)
It was very nice to meet you today, as dicussed with will plan for the following :  1) upper endoscopy and colonoscopy  2) Labwork   3)Start IBGard 1 tablet every 8-12 hours as needed

## 2023-09-20 ENCOUNTER — Other Ambulatory Visit: Payer: Self-pay

## 2023-09-21 ENCOUNTER — Other Ambulatory Visit: Payer: Self-pay

## 2023-09-23 ENCOUNTER — Other Ambulatory Visit: Payer: Self-pay

## 2023-09-24 ENCOUNTER — Other Ambulatory Visit: Payer: Self-pay

## 2023-09-24 MED ORDER — NORGESTIMATE-ETH ESTRADIOL 0.25-35 MG-MCG PO TABS
1.0000 | ORAL_TABLET | Freq: Every day | ORAL | 0 refills | Status: AC
  Filled 2023-09-24: qty 28, 28d supply, fill #0

## 2023-10-15 ENCOUNTER — Other Ambulatory Visit (HOSPITAL_COMMUNITY)
Admission: RE | Admit: 2023-10-15 | Discharge: 2023-10-15 | Disposition: A | Discharge status: Home / Self Care | Payer: BC Managed Care – PPO | Source: Ambulatory Visit | Attending: Gastroenterology | Admitting: Gastroenterology

## 2023-10-15 DIAGNOSIS — K582 Mixed irritable bowel syndrome: Secondary | ICD-10-CM | POA: Insufficient documentation

## 2023-10-15 LAB — PREGNANCY, URINE: Preg Test, Ur: NEGATIVE

## 2023-10-18 ENCOUNTER — Other Ambulatory Visit: Payer: Self-pay

## 2023-10-18 MED ORDER — CITALOPRAM HYDROBROMIDE 40 MG PO TABS
40.0000 mg | ORAL_TABLET | Freq: Every day | ORAL | 0 refills | Status: DC
  Filled 2023-10-18: qty 14, 14d supply, fill #0

## 2023-10-18 MED ORDER — CITALOPRAM HYDROBROMIDE 20 MG PO TABS
20.0000 mg | ORAL_TABLET | Freq: Every day | ORAL | 3 refills | Status: AC
  Filled 2023-10-18: qty 90, 90d supply, fill #0

## 2023-10-22 ENCOUNTER — Other Ambulatory Visit: Payer: Self-pay

## 2023-10-22 ENCOUNTER — Ambulatory Visit (HOSPITAL_COMMUNITY): Payer: BC Managed Care – PPO | Admitting: Certified Registered"

## 2023-10-22 ENCOUNTER — Encounter (INDEPENDENT_AMBULATORY_CARE_PROVIDER_SITE_OTHER): Payer: Self-pay | Admitting: Gastroenterology

## 2023-10-22 ENCOUNTER — Ambulatory Visit (HOSPITAL_COMMUNITY)
Admission: RE | Admit: 2023-10-22 | Discharge: 2023-10-22 | Disposition: A | Discharge status: Home / Self Care | Payer: BC Managed Care – PPO | Attending: Gastroenterology | Admitting: Gastroenterology

## 2023-10-22 ENCOUNTER — Encounter (HOSPITAL_COMMUNITY)
Admission: RE | Disposition: A | Discharge status: Home / Self Care | Payer: Self-pay | Source: Home / Self Care | Attending: Gastroenterology

## 2023-10-22 DIAGNOSIS — K648 Other hemorrhoids: Secondary | ICD-10-CM | POA: Diagnosis not present

## 2023-10-22 DIAGNOSIS — E669 Obesity, unspecified: Secondary | ICD-10-CM | POA: Insufficient documentation

## 2023-10-22 DIAGNOSIS — K3189 Other diseases of stomach and duodenum: Secondary | ICD-10-CM | POA: Diagnosis not present

## 2023-10-22 DIAGNOSIS — E119 Type 2 diabetes mellitus without complications: Secondary | ICD-10-CM | POA: Insufficient documentation

## 2023-10-22 DIAGNOSIS — Z6841 Body Mass Index (BMI) 40.0 and over, adult: Secondary | ICD-10-CM | POA: Insufficient documentation

## 2023-10-22 DIAGNOSIS — K640 First degree hemorrhoids: Secondary | ICD-10-CM

## 2023-10-22 DIAGNOSIS — K644 Residual hemorrhoidal skin tags: Secondary | ICD-10-CM | POA: Insufficient documentation

## 2023-10-22 DIAGNOSIS — D509 Iron deficiency anemia, unspecified: Secondary | ICD-10-CM | POA: Insufficient documentation

## 2023-10-22 DIAGNOSIS — I10 Essential (primary) hypertension: Secondary | ICD-10-CM | POA: Diagnosis not present

## 2023-10-22 DIAGNOSIS — D72820 Lymphocytosis (symptomatic): Secondary | ICD-10-CM

## 2023-10-22 DIAGNOSIS — D519 Vitamin B12 deficiency anemia, unspecified: Secondary | ICD-10-CM | POA: Insufficient documentation

## 2023-10-22 DIAGNOSIS — I890 Lymphedema, not elsewhere classified: Secondary | ICD-10-CM | POA: Insufficient documentation

## 2023-10-22 DIAGNOSIS — R1013 Epigastric pain: Secondary | ICD-10-CM | POA: Diagnosis present

## 2023-10-22 DIAGNOSIS — K319 Disease of stomach and duodenum, unspecified: Secondary | ICD-10-CM

## 2023-10-22 DIAGNOSIS — K2970 Gastritis, unspecified, without bleeding: Secondary | ICD-10-CM

## 2023-10-22 HISTORY — PX: BIOPSY: SHX5522

## 2023-10-22 HISTORY — PX: COLONOSCOPY WITH PROPOFOL: SHX5780

## 2023-10-22 HISTORY — PX: ESOPHAGOGASTRODUODENOSCOPY (EGD) WITH PROPOFOL: SHX5813

## 2023-10-22 LAB — HM COLONOSCOPY

## 2023-10-22 SURGERY — ESOPHAGOGASTRODUODENOSCOPY (EGD) WITH PROPOFOL
Anesthesia: General

## 2023-10-22 MED ORDER — EPHEDRINE 5 MG/ML INJ
INTRAVENOUS | Status: AC
  Filled 2023-10-22: qty 5

## 2023-10-22 MED ORDER — GLYCOPYRROLATE PF 0.2 MG/ML IJ SOSY
PREFILLED_SYRINGE | INTRAMUSCULAR | Status: AC
  Filled 2023-10-22: qty 1

## 2023-10-22 MED ORDER — EPHEDRINE SULFATE-NACL 50-0.9 MG/10ML-% IV SOSY
PREFILLED_SYRINGE | INTRAVENOUS | Status: DC | PRN
  Administered 2023-10-22: 10 mg via INTRAVENOUS

## 2023-10-22 MED ORDER — LIDOCAINE HCL (CARDIAC) PF 100 MG/5ML IV SOSY
PREFILLED_SYRINGE | INTRAVENOUS | Status: DC | PRN
  Administered 2023-10-22: 100 mg via INTRAVENOUS

## 2023-10-22 MED ORDER — LACTATED RINGERS IV SOLN: INTRAVENOUS | Status: DC

## 2023-10-22 MED ORDER — GLYCOPYRROLATE PF 0.2 MG/ML IJ SOSY
PREFILLED_SYRINGE | INTRAMUSCULAR | Status: DC | PRN
  Administered 2023-10-22: .2 mg via INTRAVENOUS

## 2023-10-22 MED ORDER — PROPOFOL 10 MG/ML IV BOLUS
INTRAVENOUS | Status: DC | PRN
  Administered 2023-10-22: 100 mg via INTRAVENOUS
  Administered 2023-10-22: 50 mg via INTRAVENOUS

## 2023-10-22 MED ORDER — PROPOFOL 500 MG/50ML IV EMUL
INTRAVENOUS | Status: DC | PRN
  Administered 2023-10-22: 150 ug/kg/min via INTRAVENOUS

## 2023-10-22 MED ORDER — PROPOFOL 1000 MG/100ML IV EMUL
INTRAVENOUS | Status: AC
  Filled 2023-10-22: qty 100

## 2023-10-22 MED ORDER — LIDOCAINE HCL (PF) 2 % IJ SOLN
INTRAMUSCULAR | Status: AC
Start: 2023-10-22 — End: ?
  Filled 2023-10-22: qty 5

## 2023-10-22 NOTE — Op Note (Signed)
[facility] Patient Name: Kari Moore Procedure Date: 10/22/2023 9:02 AM MRN: [id number] Date of Birth: 12/16/1985 Attending MD: Sanjuan Dame , MD, [id number] CSN: [id number] Age: 37 Admit Type: Outpatient Procedure:                Upper GI endoscopy Indications:              Epigastric abdominal pain, Abdominal pain in the                            left upper quadrant, Iron deficiency anemia,                            Vitamin B12 deficiency anemia Providers:                Sanjuan Dame, MD, Crystal Page, Elinor Parkinson Referring MD:              Medicines:                Monitored Anesthesia Care Complications:            No immediate complications. Estimated Blood Loss:     Estimated blood loss: none. Procedure:                Pre-Anesthesia Assessment:                           - Prior to the procedure, a History and Physical                            was performed, and patient medications and                            allergies were reviewed. The patient's tolerance of                            previous anesthesia was also reviewed. The risks                            and benefits of the procedure and the sedation                            options and risks were discussed with the patient.                            All questions were answered, and informed consent                            was obtained. Prior Anticoagulants: The patient has                            taken no anticoagulant or antiplatelet agents. ASA                            Grade Assessment: II - A patient with mild systemic  disease. After reviewing the risks and benefits,                            the patient was deemed in satisfactory condition to                            undergo the procedure.                           After obtaining informed consent, the endoscope was                            passed under direct vision. Throughout the                             procedure, the patient's blood pressure, pulse, and                            oxygen saturations were monitored continuously. The                            [id number]-H190 ([id number]) scope was introduced through the                            mouth, and advanced to the second part of duodenum.                            The upper GI endoscopy was accomplished without                            difficulty. The patient tolerated the procedure                            well. Scope In: 9:09:33 AM Scope Out: 9:14:25 AM Total Procedure Duration: 0 hours 4 minutes 52 seconds  Findings:      The examined esophagus was normal.      Mildly erythematous mucosa without bleeding was found in the stomach.       Biopsies were taken with a cold forceps for histology.      Lymphangiectasia was present in the duodenal bulb and in the second       portion of the duodenum. Biopsies for histology were taken with a cold       forceps for evaluation of celiac disease. Impression:               - Normal esophagus.                           - Erythematous mucosa in the stomach. Biopsied.                           - Duodenal mucosal lymphangiectasia. Moderate Sedation:      Per Anesthesia Care Recommendation:           - Patient has a contact number available for  emergencies. The signs and symptoms of potential                            delayed complications were discussed with the                            patient. Return to normal activities tomorrow.                            Written discharge instructions were provided to the                            patient.                           - Resume previous diet.                           - Continue present medications.                           - Await pathology results. Procedure Code(s):        --- Professional ---                           [phone], Esophagogastroduodenoscopy, flexible,                            transoral;  with biopsy, single or multiple Diagnosis Code(s):        --- Professional ---                           K31.89, Other diseases of stomach and duodenum                           I89.0, Lymphedema, not elsewhere classified                           R10.13, Epigastric pain                           R10.12, Left upper quadrant pain                           D50.9, Iron deficiency anemia, unspecified                           D51.9, Vitamin B12 deficiency anemia, unspecified CPT copyright 2022 American Medical Association. All rights reserved. The codes documented in this report are preliminary and upon coder review may  be revised to meet current compliance requirements. Sanjuan Dame, MD Sanjuan Dame, MD 10/22/2023 9:42:57 AM This report has been signed electronically. Number of Addenda: 0

## 2023-10-22 NOTE — Op Note (Signed)
[facility] Patient Name: Kari Moore Procedure Date: 10/22/2023 9:01 AM MRN: [id number] Date of Birth: [birth year] Attending MD: Sanjuan Dame , MD, [id number] CSN: [id number] Age: 37 Admit Type: Outpatient Procedure:                Colonoscopy Indications:              Hematochezia, Iron deficiency anemia Providers:                Sanjuan Dame, MD, Crystal Page, Elinor Parkinson Referring MD:              Medicines:                Monitored Anesthesia Care Complications:            No immediate complications. Estimated Blood Loss:     Estimated blood loss: none. Procedure:                Pre-Anesthesia Assessment:                           - Prior to the procedure, a History and Physical                            was performed, and patient medications and                            allergies were reviewed. The patient's tolerance of                            previous anesthesia was also reviewed. The risks                            and benefits of the procedure and the sedation                            options and risks were discussed with the patient.                            All questions were answered, and informed consent                            was obtained. Prior Anticoagulants: The patient has                            taken no anticoagulant or antiplatelet agents. ASA                            Grade Assessment: II - A patient with mild systemic                            disease. After reviewing the risks and benefits,                            the patient was deemed in satisfactory condition to  undergo the procedure.                           After obtaining informed consent, the colonoscope                            was passed under direct vision. Throughout the                            procedure, the patient's blood pressure, pulse, and                            oxygen saturations were monitored continuously. The                             [id number]) scope was introduced through the                            anus and advanced to the the terminal ileum. The                            colonoscopy was performed without difficulty. The                            patient tolerated the procedure well. The quality                            of the bowel preparation was evaluated using the                            BBPS [facility] Bowel Preparation Scale) with scores                            of: Right Colon = 2 (minor amount of residual                            staining, small fragments of stool and/or opaque                            liquid, but mucosa seen well), Transverse Colon = 2                            (minor amount of residual staining, small fragments                            of stool and/or opaque liquid, but mucosa seen                            well) and Left Colon = 2 (minor amount of residual                            staining, small fragments of stool and/or opaque  liquid, but mucosa seen well). The total BBPS score                            equals 6. The terminal ileum, ileocecal valve,                            appendiceal orifice, and rectum were photographed. Scope In: 9:18:03 AM Scope Out: 9:34:48 AM Scope Withdrawal Time: 0 hours 14 minutes 33 seconds  Total Procedure Duration: 0 hours 16 minutes 45 seconds  Findings:      The perianal and digital rectal examinations were normal.      A moderate amount of stool was found in the entire colon, precluding       visualization. Lavage of the area was performed using a large amount of       sterile water, resulting in clearance with fair visualization.      There is no endoscopic evidence of bleeding, inflammation or mass in the       entire colon.      Non-bleeding external and internal hemorrhoids were found during       retroflexion. The hemorrhoids were small.      The terminal ileum appeared  normal. Impression:               - Stool in the entire examined colon.                           - Non-bleeding external and internal hemorrhoids.                           - The examined portion of the ileum was normal.                           - No specimens collected. Moderate Sedation:      Per Anesthesia Care Recommendation:           - Patient has a contact number available for                            emergencies. The signs and symptoms of potential                            delayed complications were discussed with the                            patient. Return to normal activities tomorrow.                            Written discharge instructions were provided to the                            patient.                           - Resume previous diet.                           - Continue present medications.                           -  Await pathology results.                           - Repeat colonoscopy at age 3 for screening                            purposes. Procedure Code(s):        --- Professional ---                           [phone], Colonoscopy, flexible; diagnostic, including                            collection of specimen(s) by brushing or washing,                            when performed (separate procedure) Diagnosis Code(s):        --- Professional ---                           K64.8, Other hemorrhoids                           K92.1, Melena (includes Hematochezia)                           D50.9, Iron deficiency anemia, unspecified CPT copyright 2022 American Medical Association. All rights reserved. The codes documented in this report are preliminary and upon coder review may  be revised to meet current compliance requirements. Sanjuan Dame, MD Sanjuan Dame, MD 10/22/2023 9:50:05 AM This report has been signed electronically. Number of Addenda: 0

## 2023-10-22 NOTE — Transfer of Care (Signed)
Immediate Anesthesia Transfer of Care Note  Patient: Kari Moore  Procedure(s) Performed: ESOPHAGOGASTRODUODENOSCOPY (EGD) WITH PROPOFOL COLONOSCOPY WITH PROPOFOL BIOPSY  Patient Location: Endoscopy Unit  Anesthesia Type:General  Level of Consciousness: awake  Airway & Oxygen Therapy: Patient Spontanous Breathing  Post-op Assessment: Report given to RN and Post -op Vital signs reviewed and stable  Post vital signs: Reviewed and stable  Last Vitals:  Vitals Value Taken Time  BP 107/62 10/22/23 0938  Temp 36.9 C 10/22/23 0938  Pulse 81 10/22/23 0938  Resp 20 10/22/23 0938  SpO2 100 % 10/22/23 0938    Last Pain:  Vitals:   10/22/23 0938  TempSrc: Axillary  PainSc:       Patients Stated Pain Goal: 5 (10/22/23 0755)  Complications: No notable events documented.

## 2023-10-22 NOTE — Anesthesia Preprocedure Evaluation (Signed)
Anesthesia Evaluation  Patient identified by MRN, date of birth, ID band Patient awake    Reviewed: Allergy & Precautions, H&P , NPO status , Patient's Chart, lab work & pertinent test results, reviewed documented beta blocker date and time   Airway Mallampati: II  TM Distance: >3 FB Neck ROM: full    Dental no notable dental hx.    Pulmonary neg pulmonary ROS   Pulmonary exam normal breath sounds clear to auscultation       Cardiovascular Exercise Tolerance: Good hypertension, negative cardio ROS  Rhythm:regular Rate:Normal     Neuro/Psych negative neurological ROS  negative psych ROS   GI/Hepatic negative GI ROS, Neg liver ROS,,,  Endo/Other  negative endocrine ROSdiabetesHypothyroidism    Renal/GU negative Renal ROS  negative genitourinary   Musculoskeletal   Abdominal   Peds  Hematology negative hematology ROS (+)   Anesthesia Other Findings   Reproductive/Obstetrics negative OB ROS                             Anesthesia Physical Anesthesia Plan  ASA: 2  Anesthesia Plan: General   Post-op Pain Management:    Induction:   PONV Risk Score and Plan: Propofol infusion  Airway Management Planned:   Additional Equipment:   Intra-op Plan:   Post-operative Plan:   Informed Consent: I have reviewed the patients History and Physical, chart, labs and discussed the procedure including the risks, benefits and alternatives for the proposed anesthesia with the patient or authorized representative who has indicated his/her understanding and acceptance.     Dental Advisory Given  Plan Discussed with: CRNA  Anesthesia Plan Comments:        Anesthesia Quick Evaluation

## 2023-10-22 NOTE — H&P (Signed)
Primary Care Physician:  Danella Penton, MD Primary Gastroenterologist:  Dr. Tasia Catchings  Pre-Procedure History & Physical: HPI:  Kari Moore is a 37 y.o. female with hypothyroidism, migraine, IBS , vitamin B12 deficiency obesity who presents for evaluation of Left Upper quadrant abdominal pain, hematochezia, vitamin B12 deficiency   Patient presented with left upper quadrant abdominal pain for past few weeks.  Patient reports pain started suddenly accompanied by nausea nonradiating exacerbated with food intake and improves with empty stomach.  No relationship with defecation   Patient takes Excedrin on a regular basis due to migraine pain.  Patient does have alternating bowel movement with Bristol stool scale 2-3 but recently has been 7 since onset of this left upper quadrant pain   Recently patient was also noticed fresh blood upon wiping on defecating. Appears patient was seen in 2017 at [facility] for left and right upper abdominal pain where she had underwent upper endoscopy and colonoscopy in 2017 .  Patient was given diagnosis of IBS and was started on SSRI at that time   Last EGD:2017 Last Colonoscopy:2017   FHx: neg for any gastrointestinal/liver disease, no malignancies Social: neg smoking, alcohol or illicit drug use Surgical: Cholecystectomy     CRP slightly elevated 19 Iron deficiency with ferritin 33 Normal celiac serologies Normal  Vitamin B12  Spoke to the patient in person   Past Medical History:  Diagnosis Date   Allergy    Gestational diabetes    Hypothyroidism    IBS (irritable bowel syndrome)    Scoliosis    Vitamin B 12 deficiency     Past Surgical History:  Procedure Laterality Date   CHOLECYSTECTOMY     CHOLECYSTECTOMY     COLONOSCOPY     COLONOSCOPY WITH PROPOFOL N/A 08/17/2016   Procedure: COLONOSCOPY WITH PROPOFOL;  Surgeon: Scot Jun, MD;  Location: [facility] ENDOSCOPY;  Service: Endoscopy;  Laterality: N/A;   ESOPHAGOGASTRODUODENOSCOPY  (EGD) WITH PROPOFOL N/A 08/17/2016   Procedure: ESOPHAGOGASTRODUODENOSCOPY (EGD) WITH PROPOFOL;  Surgeon: Scot Jun, MD;  Location: [facility] ENDOSCOPY;  Service: Endoscopy;  Laterality: N/A;   WISDOM TOOTH EXTRACTION      Prior to Admission medications   Medication Sig Start Date End Date Taking? Authorizing Provider  Alum & Mag Hydroxide-Simeth (GI COCKTAIL) SUSP suspension Take 30 mLs by mouth 4 (four) times daily as needed for indigestion. Shake well.   Yes [provider]  calcium carbonate (TUMS - DOSED IN MG ELEMENTAL CALCIUM) 500 MG chewable tablet Chew 1 tablet by mouth as needed for indigestion or heartburn.   Yes [provider]  cetirizine (ZYRTEC) 10 MG tablet Take 10 mg by mouth daily.   Yes [provider]  citalopram (CELEXA) 20 MG tablet Take 1 tablet (20 mg total) by mouth daily. 10/18/23  Yes   cyanocobalamin (VITAMIN B12) 1000 MCG/ML injection Inject 1 mL (1,000 mcg total) into the muscle every 30 (thirty) days. 01/01/23  Yes   famotidine (PEPCID) 40 MG tablet Take 1 tablet (40 mg total) by mouth at bedtime. 09/04/23  Yes   fluticasone (FLONASE) 50 MCG/ACT nasal spray Place 2 sprays into both nostrils daily as needed.   Yes [provider]  levothyroxine (SYNTHROID) 100 MCG tablet Take 1 tablet (100 mcg total) by mouth every morning 30-60 minutes before breakfast 01/01/23  Yes   Na Sulfate-K Sulfate-Mg Sulf 17.5-3.13-1.6 GM/177ML SOLN Use as directed 09/19/23  Yes Sheridan Gettel, Juanetta Beets, MD  norgestimate-ethinyl estradiol [facility]) 0.25-35 MG-MCG tablet Take  1 tablet by mouth daily. 09/24/23  Yes   pantoprazole (PROTONIX) 40 MG tablet Take 1 tablet (40 mg total) by mouth daily. 09/19/23  Yes Meghann Landing, Juanetta Beets, MD  sucralfate (CARAFATE) 1 g tablet Take 1 tablet (1 g total) by mouth 4 (four) times daily -  before meals and at bedtime. 09/05/23  Yes   citalopram (CELEXA) 40 MG tablet Take 1 tablet (40 mg total) by mouth daily. 01/01/23      hyoscyamine (LEVSIN) 0.125 MG tablet Take 1 tablet (0.125 mg total) by mouth 2 (two) times daily. 09/04/23       Allergies as of 09/19/2023 - Review Complete 09/19/2023  Allergen Reaction Noted   Compazine [prochlorperazine edisylate] Other (See Comments) 08/16/2016   Morphine and codeine Hives 08/16/2016   Reglan [metoclopramide] Other (See Comments) 08/17/2016   Voltaren [diclofenac sodium] Nausea Only 08/16/2016    Family History  Problem Relation Age of Onset   Hypertension Mother        Preeclampsia     Social History   Socioeconomic History   Marital status: Married    Spouse name: Not on file   Number of children: Not on file   Years of education: Not on file   Highest education level: Not on file  Occupational History   Occupation: Runner, broadcasting/film/video  Tobacco Use   Smoking status: Never    Passive exposure: Never   Smokeless tobacco: Never  Vaping Use   Vaping status: Never Used  Substance and Sexual Activity   Alcohol use: No   Drug use: No   Sexual activity: Yes  Other Topics Concern   Not on file  Social History Narrative   Not on file   Social Drivers of Health   Financial Resource Strain: Low Risk  (01/01/2023)   Received from [facility]   Overall Financial Resource Strain (CARDIA)    Difficulty of Paying Living Expenses: Not hard at all  Food Insecurity: No Food Insecurity (01/01/2023)   Received from [facility]   Hunger Vital Sign    Worried About Running Out of Food in the Last Year: Never true    Ran Out of Food in the Last Year: Never true  Transportation Needs: No Transportation Needs (01/01/2023)   Received from [facility] - Transportation    In the past 12 months, has lack of transportation kept you from medical appointments or from getting medications?: No    Lack of Transportation (Non-Medical): No  Physical Activity: Not on file  Stress: Not on file  Social Connections: Not on  file  Intimate Partner Violence: Not on file    Review of Systems: See HPI, otherwise negative ROS  Physical Exam: Vital signs in last 24 hours: Temp:  [97.9 F (36.6 C)] 97.9 F (36.6 C) (12/30 0755) Pulse Rate:  [65] 65 (12/30 0755) Resp:  [14] 14 (12/30 0755) BP: (105)/(80) 105/80 (12/30 0755) SpO2:  [98 %] 98 % (12/30 0755) Weight:  [97.5 kg] 97.5 kg (12/30 0755)   General:   Alert,  Well-developed, well-nourished, pleasant and cooperative in NAD Head:  Normocephalic and atraumatic. Eyes:  Sclera clear, no icterus.   Conjunctiva pink. Ears:  Normal auditory acuity. Nose:  No deformity, discharge,  or lesions. Msk:  Symmetrical without gross deformities. Normal posture. Extremities:  Without clubbing or edema. Neurologic:  Alert and  oriented x4;  grossly normal neurologically. Skin:  Intact without significant lesions or rashes. Psych:  Alert and cooperative. Normal mood and affect.  Impression/Plan:  Kari Moore is a 37 y.o. female with hypothyroidism, migraine, IBS , vitamin B12 deficiency obesity who presents for evaluation of Left Upper quadrant abdominal pain, hematochezia, vitamin B12 deficiency .   Proceed with EGD and colonoscopy for abdominal pain , iron deficiency and Hematochezia   The risks of the procedure including infection, bleed, or perforation as well as benefits, limitations, alternatives and imponderables have been reviewed with the patient. Questions have been answered. All parties agreeable.

## 2023-10-22 NOTE — Discharge Instructions (Signed)

## 2023-10-23 ENCOUNTER — Encounter (INDEPENDENT_AMBULATORY_CARE_PROVIDER_SITE_OTHER): Payer: Self-pay | Admitting: *Deleted

## 2023-10-23 LAB — SURGICAL PATHOLOGY

## 2023-10-23 MED ORDER — PANTOPRAZOLE SODIUM 40 MG PO TBEC: 40.0000 mg | DELAYED_RELEASE_TABLET | Freq: Every day | ORAL | 3 refills | Status: AC

## 2023-10-23 NOTE — Anesthesia Postprocedure Evaluation (Signed)
 Anesthesia Post Note  Patient: Kari Moore  Procedure(s) Performed: ESOPHAGOGASTRODUODENOSCOPY (EGD) WITH PROPOFOL  COLONOSCOPY WITH PROPOFOL  BIOPSY  Patient location during evaluation: Phase II Anesthesia Type: General Level of consciousness: awake Pain management: pain level controlled Vital Signs Assessment: post-procedure vital signs reviewed and stable Respiratory status: spontaneous breathing and respiratory function stable Cardiovascular status: blood pressure returned to baseline and stable Postop Assessment: no headache and no apparent nausea or vomiting Anesthetic complications: no Comments: Late entry   No notable events documented.   Last Vitals:  Vitals:   10/22/23 0755 10/22/23 0938  BP: 105/80 107/62  Pulse: 65 81  Resp: 14 20  Temp: 36.6 C 36.9 C  SpO2: 98% 100%    Last Pain:  Vitals:   10/22/23 0939  TempSrc:   PainSc: 0-No pain                 Yvonna JINNY Bosworth

## 2023-10-26 LAB — CELIAC AB TTG DGP TIGA
Antigliadin Abs, IgA: 4 U (ref 0–19)
Gliadin IgG: 2 U (ref 0–19)
IgA/Immunoglobulin A, Serum: 200 mg/dL (ref 87–352)
Tissue Transglut Ab: <2 U/mL (ref 0–5)
Transglutaminase IgA: <2 U/mL (ref 0–3)

## 2023-10-26 LAB — FERRITIN: Ferritin: 33 ng/mL (ref 15–150)

## 2023-10-26 LAB — METHYLMALONIC ACID, SERUM: Methylmalonic Acid, Serum: 91 nmol/L (ref 0–378)

## 2023-10-26 LAB — VITAMIN B12 DEFICIENCY CASCADE: Vitamin B-12: 312 pg/mL (ref 232–1245)

## 2023-10-26 LAB — INTERPRETATION

## 2023-10-26 LAB — C-REACTIVE PROTEIN: CRP: 19 mg/L — ABNORMAL HIGH (ref 0–10)

## 2023-10-29 ENCOUNTER — Encounter (INDEPENDENT_AMBULATORY_CARE_PROVIDER_SITE_OTHER): Payer: Self-pay | Admitting: *Deleted

## 2023-11-02 ENCOUNTER — Encounter (HOSPITAL_COMMUNITY): Payer: Self-pay | Admitting: Gastroenterology

## 2024-06-04 ENCOUNTER — Other Ambulatory Visit: Payer: Self-pay

## 2024-06-04 MED ORDER — CITALOPRAM HYDROBROMIDE 40 MG PO TABS
40.0000 mg | ORAL_TABLET | Freq: Every day | ORAL | 3 refills | Status: AC
  Filled 2024-06-04: qty 90, 90d supply, fill #0

## 2024-06-05 ENCOUNTER — Other Ambulatory Visit: Payer: Self-pay

## 2024-08-01 ENCOUNTER — Telehealth: Admitting: Family Medicine

## 2024-08-01 DIAGNOSIS — S70361A Insect bite (nonvenomous), right thigh, initial encounter: Secondary | ICD-10-CM | POA: Diagnosis not present

## 2024-08-01 DIAGNOSIS — W57XXXA Bitten or stung by nonvenomous insect and other nonvenomous arthropods, initial encounter: Secondary | ICD-10-CM | POA: Diagnosis not present

## 2024-08-01 MED ORDER — PREDNISONE 20 MG PO TABS
20.0000 mg | ORAL_TABLET | Freq: Two times a day (BID) | ORAL | 0 refills | Status: AC
Start: 2024-08-01 — End: 2024-08-06

## 2024-08-01 NOTE — Progress Notes (Signed)
 E Visit for Insect Sting  Thank you for describing the insect sting for us .  Here is how we plan to help!  A sting that we will treat with a short course of prednisone.  The 2 greatest risks from insect stings are allergic reaction, which can be fatal in some people and infection, which is more common and less serious.  Bees, wasps, yellow jackets, and hornets belong to a class of insects called Hymenoptera.  Most insect stings cause only minor discomfort.  Stings can happen anywhere on the body and can be painful.  Most stings are from honey bees or yellow jackets.  Fire ants can sting multiple times.  The sites of the stings are more likely to become infected.    Provided a home care guide for insect stings and instructions on when to call for help. and I have sent in prednisone 40 mg by mouth daily for 5 days to the pharmacy you selected.  Please make sure that you selected a pharmacy that is open now.  What can be used to prevent Insect Stings?  Insect repellant with at least 20% DEET.  Wearing long pants and shirts with socks and shoes.  Wear dark or drab-colored clothes rather than bright colors.  Avoid using perfumes and hair sprays; these attract insects.  HOME CARE ADVICE:  1. Stinger removal: The stinger looks like a tiny black dot in the sting. Use a fingernail, credit card edge, or knife-edge to scrape it off.  Don't pull it out because it squeezes out more venom. If the stinger is below the skin surface, leave it alone.  It will be shed with normal skin healing. 2. Use cold compresses to the area of the sting for 10-20 minutes.  You may repeat this as needed to relieve symptoms of pain and swelling. 3.  For pain relief, take acetominophen 650 mg 4-6 hours as needed or ibuprofen  400 mg every 6-8 hours as needed or naproxen 250-500 mg every 12 hours as needed. 4.  You can also use hydrocortisone cream 0.5% or 1% up to 4 times daily as needed for itching. 5.  If the sting  becomes very itchy, take Benadryl  25-50 mg, follow directions on box. 6.  Wash the area 2-3 times daily with antibacterial soap and warm water. 7. Call your Doctor if: Fever, a severe headache, or rash occur in the next 2 weeks. Sting area begins to look infected. Redness and swelling worsens after home treatment. Your current symptoms become worse.    MAKE SURE YOU:  Understand these instructions. Will watch your condition. Will get help right away if you are not doing well or get worse.  Thank you for choosing an e-visit. Your e-visit answers were reviewed by a board certified advanced clinical practitioner to complete your personal care plan. Depending upon the condition, your plan could have included both over the counter or prescription medications. Please review your pharmacy choice. Be sure that the pharmacy you have chosen is open so that you can pick up your prescription now.  If there is a problem you may message your provider in MyChart to have the prescription routed to another pharmacy. Your safety is important to us . If you have drug allergies check your prescription carefully.  For the next 24 hours, you can use MyChart to ask questions about today's visit, request a non-urgent call back, or ask for a work or school excuse from your e-visit provider. You will get an email in the  next two days asking about your experience. I hope that your e-visit has been valuable and will speed your recovery.   I have spent 5 minutes in review of e-visit questionnaire, review and updating patient chart, medical decision making and response to patient.   Ethelyne Erich, FNP

## 2024-09-08 ENCOUNTER — Other Ambulatory Visit (HOSPITAL_BASED_OUTPATIENT_CLINIC_OR_DEPARTMENT_OTHER): Payer: Self-pay
